# Patient Record
Sex: Male | Born: 1965 | Race: Black or African American | Hispanic: No | Smoking: Never smoker
Health system: Southern US, Community
[De-identification: ages and names within clinical notes are randomized; demographics above are authoritative.]

## PROBLEM LIST (undated history)

## (undated) DIAGNOSIS — E119 Type 2 diabetes mellitus without complications: Secondary | ICD-10-CM

## (undated) HISTORY — PX: COLON SURGERY: SHX602

---

## 2008-07-10 HISTORY — PX: OTHER SURGICAL HISTORY: SHX169

## 2009-04-20 ENCOUNTER — Encounter (INDEPENDENT_AMBULATORY_CARE_PROVIDER_SITE_OTHER): Payer: Self-pay | Admitting: General Surgery

## 2009-04-21 ENCOUNTER — Inpatient Hospital Stay (HOSPITAL_COMMUNITY): Admission: AC | Admit: 2009-04-21 | Discharge: 2009-04-28 | Payer: Self-pay

## 2009-05-04 ENCOUNTER — Inpatient Hospital Stay (HOSPITAL_COMMUNITY): Admission: EM | Admit: 2009-05-04 | Discharge: 2009-05-07 | Payer: Self-pay | Admitting: Emergency Medicine

## 2009-05-28 ENCOUNTER — Emergency Department (HOSPITAL_COMMUNITY): Admission: EM | Admit: 2009-05-28 | Discharge: 2009-05-28 | Payer: Self-pay | Admitting: Emergency Medicine

## 2010-10-13 LAB — DIFFERENTIAL
Basophils Relative: 0 % (ref 0–1)
Eosinophils Absolute: 0 10*3/uL (ref 0.0–0.7)
Lymphocytes Relative: 8 % — ABNORMAL LOW (ref 12–46)
Lymphs Abs: 1.2 10*3/uL (ref 0.7–4.0)
Monocytes Absolute: 1.3 10*3/uL — ABNORMAL HIGH (ref 0.1–1.0)
Neutro Abs: 13.3 10*3/uL — ABNORMAL HIGH (ref 1.7–7.7)
Neutrophils Relative %: 84 % — ABNORMAL HIGH (ref 43–77)

## 2010-10-13 LAB — CBC
HCT: 19.9 % — ABNORMAL LOW (ref 39.0–52.0)
HCT: 20.5 % — ABNORMAL LOW (ref 39.0–52.0)
HCT: 24.5 % — ABNORMAL LOW (ref 39.0–52.0)
HCT: 22.2 % — ABNORMAL LOW (ref 39.0–52.0)
HCT: 27.7 % — ABNORMAL LOW (ref 39.0–52.0)
HCT: 32.4 % — ABNORMAL LOW (ref 39.0–52.0)
HCT: 38.1 % — ABNORMAL LOW (ref 39.0–52.0)
Hemoglobin: 6.9 g/dL — CL (ref 13.0–17.0)
Hemoglobin: 7.4 g/dL — CL (ref 13.0–17.0)
Hemoglobin: 8.4 g/dL — ABNORMAL LOW (ref 13.0–17.0)
Hemoglobin: 10.9 g/dL — ABNORMAL LOW (ref 13.0–17.0)
Hemoglobin: 13.1 g/dL (ref 13.0–17.0)
Hemoglobin: 7.9 g/dL — CL (ref 13.0–17.0)
Hemoglobin: 9.6 g/dL — ABNORMAL LOW (ref 13.0–17.0)
MCHC: 33.3 g/dL (ref 30.0–36.0)
MCHC: 33.9 g/dL (ref 30.0–36.0)
MCHC: 34.5 g/dL (ref 30.0–36.0)
MCHC: 34.8 g/dL (ref 30.0–36.0)
MCHC: 35 g/dL (ref 30.0–36.0)
MCV: 93.3 fL (ref 78.0–100.0)
MCV: 94 fL (ref 78.0–100.0)
MCV: 96 fL (ref 78.0–100.0)
MCV: 95.5 fL (ref 78.0–100.0)
MCV: 95.9 fL (ref 78.0–100.0)
Platelets: 493 10*3/uL — ABNORMAL HIGH (ref 150–400)
Platelets: 512 10*3/uL — ABNORMAL HIGH (ref 150–400)
Platelets: 515 10*3/uL — ABNORMAL HIGH (ref 150–400)
Platelets: 134 10*3/uL — ABNORMAL LOW (ref 150–400)
Platelets: 165 10*3/uL (ref 150–400)
Platelets: 176 10*3/uL (ref 150–400)
RBC: 2.18 MIL/uL — ABNORMAL LOW (ref 4.22–5.81)
RBC: 2.38 MIL/uL — ABNORMAL LOW (ref 4.22–5.81)
RBC: 2.39 MIL/uL — ABNORMAL LOW (ref 4.22–5.81)
RBC: 2.72 MIL/uL — ABNORMAL LOW (ref 4.22–5.81)
RBC: 2.33 MIL/uL — ABNORMAL LOW (ref 4.22–5.81)
RBC: 2.36 MIL/uL — ABNORMAL LOW (ref 4.22–5.81)
RBC: 3.99 MIL/uL — ABNORMAL LOW (ref 4.22–5.81)
RDW: 15.7 % — ABNORMAL HIGH (ref 11.5–15.5)
RDW: 16.5 % — ABNORMAL HIGH (ref 11.5–15.5)
RDW: 19 % — ABNORMAL HIGH (ref 11.5–15.5)
RDW: 13.9 % (ref 11.5–15.5)
RDW: 13.9 % (ref 11.5–15.5)
RDW: 14.2 % (ref 11.5–15.5)
WBC: 15.8 10*3/uL — ABNORMAL HIGH (ref 4.0–10.5)
WBC: 9.5 10*3/uL (ref 4.0–10.5)
WBC: 9.5 10*3/uL (ref 4.0–10.5)
WBC: 11.4 10*3/uL — ABNORMAL HIGH (ref 4.0–10.5)
WBC: 9.1 10*3/uL (ref 4.0–10.5)
WBC: 9.8 10*3/uL (ref 4.0–10.5)

## 2010-10-13 LAB — TYPE AND SCREEN
Antibody Screen: NEGATIVE
Antibody Screen: NEGATIVE

## 2010-10-13 LAB — BASIC METABOLIC PANEL
BUN: 18 mg/dL (ref 6–23)
CO2: 29 mEq/L (ref 19–32)
Calcium: 8.4 mg/dL (ref 8.4–10.5)
GFR calc Af Amer: >60 mL/min (ref >60)
GFR calc non Af Amer: >60 mL/min (ref >60)
GFR calc non Af Amer: 56 mL/min — ABNORMAL LOW (ref >60)
Glucose, Bld: 146 mg/dL — ABNORMAL HIGH (ref 70–99)
Glucose, Bld: 129 mg/dL — ABNORMAL HIGH (ref 70–99)
Potassium: 3.9 mEq/L (ref 3.5–5.1)
Potassium: 3.8 mEq/L (ref 3.5–5.1)
Potassium: 4.5 mEq/L (ref 3.5–5.1)
Sodium: 139 mEq/L (ref 135–145)
Sodium: 138 mEq/L (ref 135–145)

## 2010-10-13 LAB — POCT I-STAT, CHEM 8
Calcium, Ion: 1 mmol/L — ABNORMAL LOW (ref 1.12–1.32)
Glucose, Bld: 152 mg/dL — ABNORMAL HIGH (ref 70–99)
HCT: 40 % (ref 39.0–52.0)
Hemoglobin: 13.6 g/dL (ref 13.0–17.0)
TCO2: 20 mmol/L (ref 0–100)

## 2010-10-13 LAB — GLUCOSE, CAPILLARY
Glucose-Capillary: 107 mg/dL — ABNORMAL HIGH (ref 70–99)
Glucose-Capillary: 114 mg/dL — ABNORMAL HIGH (ref 70–99)

## 2010-10-13 LAB — HEMOGLOBIN A1C: Hgb A1c MFr Bld: 6.1 % (ref 4.6–6.1)

## 2010-10-13 LAB — CULTURE, ROUTINE-ABSCESS

## 2010-10-13 LAB — COMPREHENSIVE METABOLIC PANEL
ALT: 31 U/L (ref 0–53)
ALT: 37 U/L (ref 0–53)
CO2: 25 mEq/L (ref 19–32)
Calcium: 8.8 mg/dL (ref 8.4–10.5)
Chloride: 101 mEq/L (ref 96–112)
Chloride: 108 mEq/L (ref 96–112)
GFR calc Af Amer: >60 mL/min (ref >60)
GFR calc non Af Amer: >60 mL/min (ref >60)
GFR calc non Af Amer: >60 mL/min (ref >60)
Glucose, Bld: 112 mg/dL — ABNORMAL HIGH (ref 70–99)
Total Bilirubin: 1.5 mg/dL — ABNORMAL HIGH (ref 0.3–1.2)
Total Protein: 6.4 g/dL (ref 6.0–8.3)
Total Protein: 7.2 g/dL (ref 6.0–8.3)

## 2010-10-13 LAB — URINALYSIS, MICROSCOPIC ONLY
Ketones, ur: NEGATIVE mg/dL
Leukocytes, UA: NEGATIVE
Nitrite: NEGATIVE
Specific Gravity, Urine: 1.025 (ref 1.005–1.030)
pH: 5 (ref 5.0–8.0)

## 2010-10-13 LAB — PROTIME-INR: Prothrombin Time: 13.5 seconds (ref 11.6–15.2)

## 2010-10-13 LAB — ETHANOL: Alcohol, Ethyl (B): 309 mg/dL — ABNORMAL HIGH (ref 0–10)

## 2010-10-13 LAB — POCT CARDIAC MARKERS: Troponin i, poc: <0.05 ng/mL (ref 0.00–0.09)

## 2010-10-13 LAB — ABO/RH: ABO/RH(D): O POS

## 2010-10-13 LAB — URINALYSIS, ROUTINE W REFLEX MICROSCOPIC
Ketones, ur: NEGATIVE mg/dL
Leukocytes, UA: NEGATIVE
pH: 6 (ref 5.0–8.0)

## 2010-10-13 LAB — URINE MICROSCOPIC-ADD ON

## 2010-10-13 LAB — POCT I-STAT 4, (NA,K, GLUC, HGB,HCT): HCT: 31 % — ABNORMAL LOW (ref 39.0–52.0)

## 2010-10-13 LAB — PREPARE RBC (CROSSMATCH)
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
## 2012-04-04 ENCOUNTER — Emergency Department (HOSPITAL_COMMUNITY): Payer: No Typology Code available for payment source

## 2012-04-04 ENCOUNTER — Emergency Department (HOSPITAL_COMMUNITY)
Admission: EM | Admit: 2012-04-04 | Discharge: 2012-04-04 | Disposition: A | Discharge status: Home / Self Care | Payer: No Typology Code available for payment source | Attending: Emergency Medicine | Admitting: Emergency Medicine

## 2012-04-04 ENCOUNTER — Encounter (HOSPITAL_COMMUNITY): Payer: Self-pay | Admitting: Emergency Medicine

## 2012-04-04 DIAGNOSIS — S161XXA Strain of muscle, fascia and tendon at neck level, initial encounter: Secondary | ICD-10-CM

## 2012-04-04 DIAGNOSIS — S139XXA Sprain of joints and ligaments of unspecified parts of neck, initial encounter: Secondary | ICD-10-CM | POA: Insufficient documentation

## 2012-04-04 DIAGNOSIS — S335XXA Sprain of ligaments of lumbar spine, initial encounter: Secondary | ICD-10-CM | POA: Insufficient documentation

## 2012-04-04 MED ORDER — HYDROCODONE-ACETAMINOPHEN 5-325 MG PO TABS: 2.0000 | ORAL_TABLET | ORAL | Status: DC | PRN

## 2012-04-04 MED ORDER — IBUPROFEN 400 MG PO TABS
800.0000 mg | ORAL_TABLET | Freq: Once | ORAL | Status: AC
  Administered 2012-04-04: 800 mg via ORAL
  Filled 2012-04-04: qty 2

## 2012-04-04 MED ORDER — IBUPROFEN 800 MG PO TABS: 800.0000 mg | ORAL_TABLET | Freq: Three times a day (TID) | ORAL | Status: DC
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
## 2016-12-13 ENCOUNTER — Emergency Department (HOSPITAL_COMMUNITY): Payer: Self-pay

## 2016-12-13 ENCOUNTER — Encounter (HOSPITAL_COMMUNITY)
Admission: EM | Disposition: A | Discharge status: Home / Self Care | Payer: Self-pay | Source: Home / Self Care | Attending: Urology

## 2016-12-13 ENCOUNTER — Inpatient Hospital Stay (HOSPITAL_COMMUNITY): Payer: Self-pay | Admitting: Certified Registered Nurse Anesthetist

## 2016-12-13 ENCOUNTER — Encounter (HOSPITAL_COMMUNITY): Payer: Self-pay

## 2016-12-13 ENCOUNTER — Inpatient Hospital Stay (HOSPITAL_COMMUNITY)
Admission: EM | Admit: 2016-12-13 | Discharge: 2016-12-20 | DRG: 728 | Disposition: A | Discharge status: Home / Self Care | Payer: Self-pay | Attending: Urology | Admitting: Urology

## 2016-12-13 DIAGNOSIS — N451 Epididymitis: Secondary | ICD-10-CM

## 2016-12-13 DIAGNOSIS — N5089 Other specified disorders of the male genital organs: Secondary | ICD-10-CM | POA: Diagnosis present

## 2016-12-13 DIAGNOSIS — B9562 Methicillin resistant Staphylococcus aureus infection as the cause of diseases classified elsewhere: Secondary | ICD-10-CM | POA: Diagnosis present

## 2016-12-13 DIAGNOSIS — N492 Inflammatory disorders of scrotum: Principal | ICD-10-CM | POA: Diagnosis present

## 2016-12-13 DIAGNOSIS — N179 Acute kidney failure, unspecified: Secondary | ICD-10-CM | POA: Diagnosis present

## 2016-12-13 DIAGNOSIS — E872 Acidosis: Secondary | ICD-10-CM | POA: Diagnosis present

## 2016-12-13 DIAGNOSIS — D72829 Elevated white blood cell count, unspecified: Secondary | ICD-10-CM

## 2016-12-13 DIAGNOSIS — Z6834 Body mass index (BMI) 34.0-34.9, adult: Secondary | ICD-10-CM

## 2016-12-13 DIAGNOSIS — Z1621 Resistance to vancomycin: Secondary | ICD-10-CM | POA: Diagnosis present

## 2016-12-13 DIAGNOSIS — N472 Paraphimosis: Secondary | ICD-10-CM | POA: Diagnosis present

## 2016-12-13 HISTORY — PX: INCISION AND DRAINAGE ABSCESS: SHX5864

## 2016-12-13 LAB — COMPREHENSIVE METABOLIC PANEL
ALK PHOS: 61 U/L (ref 38–126)
ALT: 22 U/L (ref 17–63)
ANION GAP: 14 (ref 5–15)
AST: 39 U/L (ref 15–41)
Albumin: 3.2 g/dL — ABNORMAL LOW (ref 3.5–5.0)
BILIRUBIN TOTAL: 2.1 mg/dL — AB (ref 0.3–1.2)
BUN: 15 mg/dL (ref 6–20)
CALCIUM: 8.4 mg/dL — AB (ref 8.9–10.3)
CO2: 19 mmol/L — ABNORMAL LOW (ref 22–32)
CREATININE: 2.55 mg/dL — AB (ref 0.61–1.24)
Chloride: 99 mmol/L — ABNORMAL LOW (ref 101–111)
GFR calc Af Amer: 33 mL/min — ABNORMAL LOW (ref >60)
GFR, EST NON AFRICAN AMERICAN: 28 mL/min — AB (ref >60)
Glucose, Bld: 181 mg/dL — ABNORMAL HIGH (ref 65–99)
Potassium: 3.7 mmol/L (ref 3.5–5.1)
Sodium: 132 mmol/L — ABNORMAL LOW (ref 135–145)
TOTAL PROTEIN: 7.8 g/dL (ref 6.5–8.1)

## 2016-12-13 LAB — I-STAT CG4 LACTIC ACID, ED
Lactic Acid, Venous: 2.95 mmol/L (ref 0.5–1.9)
Lactic Acid, Venous: 1.14 mmol/L (ref 0.5–1.9)

## 2016-12-13 LAB — URINALYSIS, ROUTINE W REFLEX MICROSCOPIC
Bilirubin Urine: NEGATIVE
Glucose, UA: NEGATIVE mg/dL
Ketones, ur: NEGATIVE mg/dL
Leukocytes, UA: NEGATIVE
NITRITE: NEGATIVE
Protein, ur: 100 mg/dL — AB
SPECIFIC GRAVITY, URINE: 1.016 (ref 1.005–1.030)
pH: 5 (ref 5.0–8.0)

## 2016-12-13 LAB — CBC
HCT: 35.2 % — ABNORMAL LOW (ref 39.0–52.0)
Hemoglobin: 12.2 g/dL — ABNORMAL LOW (ref 13.0–17.0)
MCH: 31.8 pg (ref 26.0–34.0)
MCHC: 34.7 g/dL (ref 30.0–36.0)
MCV: 91.7 fL (ref 78.0–100.0)
PLATELETS: 162 10*3/uL (ref 150–400)
RBC: 3.84 MIL/uL — ABNORMAL LOW (ref 4.22–5.81)
RDW: 13.3 % (ref 11.5–15.5)
WBC: 15.2 10*3/uL — AB (ref 4.0–10.5)

## 2016-12-13 LAB — CREATININE, SERUM
CREATININE: 1.84 mg/dL — AB (ref 0.61–1.24)
GFR calc non Af Amer: 42 mL/min — ABNORMAL LOW (ref >60)
GFR, EST AFRICAN AMERICAN: 48 mL/min — AB (ref >60)

## 2016-12-13 LAB — CBC WITH DIFFERENTIAL/PLATELET
BASOS ABS: 0 10*3/uL (ref 0.0–0.1)
BASOS PCT: 0 %
EOS ABS: 0 10*3/uL (ref 0.0–0.7)
Eosinophils Relative: 0 %
HCT: 41.9 % (ref 39.0–52.0)
Hemoglobin: 14.5 g/dL (ref 13.0–17.0)
Lymphocytes Relative: 6 %
Lymphs Abs: 1 10*3/uL (ref 0.7–4.0)
MCH: 31.7 pg (ref 26.0–34.0)
MCHC: 34.6 g/dL (ref 30.0–36.0)
MCV: 91.7 fL (ref 78.0–100.0)
MONO ABS: 1.8 10*3/uL — AB (ref 0.1–1.0)
Monocytes Relative: 11 %
NEUTROS PCT: 83 %
Neutro Abs: 13.5 10*3/uL — ABNORMAL HIGH (ref 1.7–7.7)
PLATELETS: 162 10*3/uL (ref 150–400)
RBC: 4.57 MIL/uL (ref 4.22–5.81)
RDW: 13.4 % (ref 11.5–15.5)
WBC: 16.3 10*3/uL — AB (ref 4.0–10.5)

## 2016-12-13 LAB — MRSA PCR SCREENING: MRSA by PCR: NEGATIVE

## 2016-12-13 SURGERY — INCISION AND DRAINAGE, ABSCESS
Anesthesia: General | Site: Scrotum

## 2016-12-13 MED ORDER — ACETAMINOPHEN 325 MG PO TABS
650.0000 mg | ORAL_TABLET | Freq: Four times a day (QID) | ORAL | Status: DC | PRN
  Administered 2016-12-14 – 2016-12-16 (×4): 650 mg via ORAL
  Filled 2016-12-13 (×5): qty 2

## 2016-12-13 MED ORDER — PROPOFOL 10 MG/ML IV BOLUS
INTRAVENOUS | Status: AC
  Filled 2016-12-13: qty 20

## 2016-12-13 MED ORDER — LACTATED RINGERS IV SOLN: INTRAVENOUS | Status: DC

## 2016-12-13 MED ORDER — FENTANYL CITRATE (PF) 100 MCG/2ML IJ SOLN
25.0000 ug | INTRAMUSCULAR | Status: DC | PRN
  Administered 2016-12-13 (×2): 50 ug via INTRAVENOUS

## 2016-12-13 MED ORDER — HYDROCODONE-ACETAMINOPHEN 5-325 MG PO TABS: 1.0000 | ORAL_TABLET | Freq: Four times a day (QID) | ORAL | 0 refills | Status: DC | PRN

## 2016-12-13 MED ORDER — TRAMADOL HCL 50 MG PO TABS: 50.0000 mg | ORAL_TABLET | Freq: Four times a day (QID) | ORAL | Status: DC | PRN

## 2016-12-13 MED ORDER — DEXTROSE 5 % IV SOLN
1.0000 g | INTRAVENOUS | Status: DC
  Administered 2016-12-14 – 2016-12-15 (×2): 1 g via INTRAVENOUS
  Filled 2016-12-13 (×2): qty 10

## 2016-12-13 MED ORDER — SODIUM CHLORIDE 0.9 % IV BOLUS (SEPSIS)
1000.0000 mL | Freq: Once | INTRAVENOUS | Status: AC
  Administered 2016-12-13: 1000 mL via INTRAVENOUS

## 2016-12-13 MED ORDER — HYDROCODONE-ACETAMINOPHEN 5-325 MG PO TABS
1.0000 | ORAL_TABLET | Freq: Four times a day (QID) | ORAL | Status: DC | PRN
  Administered 2016-12-13 – 2016-12-20 (×8): 2 via ORAL
  Filled 2016-12-13 (×9): qty 2

## 2016-12-13 MED ORDER — DIPHENHYDRAMINE HCL 12.5 MG/5ML PO ELIX: 12.5000 mg | ORAL_SOLUTION | Freq: Four times a day (QID) | ORAL | Status: DC | PRN

## 2016-12-13 MED ORDER — LACTATED RINGERS IV SOLN
INTRAVENOUS | Status: DC
  Administered 2016-12-13 – 2016-12-15 (×5): via INTRAVENOUS

## 2016-12-13 MED ORDER — FENTANYL CITRATE (PF) 100 MCG/2ML IJ SOLN
INTRAMUSCULAR | Status: AC
  Filled 2016-12-13: qty 2

## 2016-12-13 MED ORDER — HYDROMORPHONE HCL 1 MG/ML IJ SOLN
INTRAMUSCULAR | Status: AC
  Filled 2016-12-13: qty 1

## 2016-12-13 MED ORDER — LIDOCAINE 2% (20 MG/ML) 5 ML SYRINGE
INTRAMUSCULAR | Status: DC | PRN
  Administered 2016-12-13: 60 mg via INTRAVENOUS

## 2016-12-13 MED ORDER — ACETAMINOPHEN 650 MG RE SUPP
650.0000 mg | Freq: Four times a day (QID) | RECTAL | Status: DC | PRN
  Administered 2016-12-13: 650 mg via RECTAL
  Filled 2016-12-13: qty 1

## 2016-12-13 MED ORDER — MORPHINE SULFATE (PF) 4 MG/ML IV SOLN
4.0000 mg | Freq: Once | INTRAVENOUS | Status: AC
  Administered 2016-12-13: 4 mg via INTRAVENOUS
  Filled 2016-12-13: qty 1

## 2016-12-13 MED ORDER — MIDAZOLAM HCL 2 MG/2ML IJ SOLN
INTRAMUSCULAR | Status: AC
  Filled 2016-12-13: qty 2

## 2016-12-13 MED ORDER — HYDROMORPHONE HCL 1 MG/ML IJ SOLN
1.0000 mg | INTRAMUSCULAR | Status: DC | PRN
  Administered 2016-12-14 (×2): 1 mg via INTRAVENOUS
  Filled 2016-12-13 (×3): qty 1

## 2016-12-13 MED ORDER — PHENYLEPHRINE 40 MCG/ML (10ML) SYRINGE FOR IV PUSH (FOR BLOOD PRESSURE SUPPORT)
PREFILLED_SYRINGE | INTRAVENOUS | Status: DC | PRN
  Administered 2016-12-13: 80 ug via INTRAVENOUS
  Administered 2016-12-13 (×2): 120 ug via INTRAVENOUS
  Administered 2016-12-13: 80 ug via INTRAVENOUS
  Administered 2016-12-13: 120 ug via INTRAVENOUS

## 2016-12-13 MED ORDER — ENOXAPARIN SODIUM 40 MG/0.4ML ~~LOC~~ SOLN: 40.0000 mg | SUBCUTANEOUS | Status: DC

## 2016-12-13 MED ORDER — ONDANSETRON HCL 4 MG/2ML IJ SOLN
4.0000 mg | INTRAMUSCULAR | Status: DC | PRN
  Administered 2016-12-13 – 2016-12-18 (×2): 4 mg via INTRAVENOUS

## 2016-12-13 MED ORDER — MEPERIDINE HCL 50 MG/ML IJ SOLN: 6.2500 mg | INTRAMUSCULAR | Status: DC | PRN

## 2016-12-13 MED ORDER — DEXTROSE 5 % IV SOLN
2.0000 g | Freq: Once | INTRAVENOUS | Status: AC
  Administered 2016-12-13: 2 g via INTRAVENOUS
  Filled 2016-12-13: qty 2

## 2016-12-13 MED ORDER — MIDAZOLAM HCL 5 MG/5ML IJ SOLN
INTRAMUSCULAR | Status: DC | PRN
  Administered 2016-12-13: 2 mg via INTRAVENOUS

## 2016-12-13 MED ORDER — DIPHENHYDRAMINE HCL 50 MG/ML IJ SOLN: 12.5000 mg | Freq: Four times a day (QID) | INTRAMUSCULAR | Status: DC | PRN

## 2016-12-13 MED ORDER — DEXTROSE 5 % IV SOLN: 1.0000 g | INTRAVENOUS | Status: DC

## 2016-12-13 MED ORDER — METOCLOPRAMIDE HCL 5 MG/ML IJ SOLN
10.0000 mg | Freq: Once | INTRAMUSCULAR | Status: DC | PRN
Start: 2016-12-13 — End: 2016-12-13

## 2016-12-13 MED ORDER — SODIUM CHLORIDE 0.9 % IV BOLUS (SEPSIS)
500.0000 mL | Freq: Once | INTRAVENOUS | Status: AC
  Administered 2016-12-13: 500 mL via INTRAVENOUS

## 2016-12-13 MED ORDER — DOCUSATE SODIUM 100 MG PO CAPS
100.0000 mg | ORAL_CAPSULE | Freq: Two times a day (BID) | ORAL | Status: DC
  Administered 2016-12-13 – 2016-12-20 (×9): 100 mg via ORAL
  Filled 2016-12-13 (×11): qty 1

## 2016-12-13 MED ORDER — FENTANYL CITRATE (PF) 100 MCG/2ML IJ SOLN
INTRAMUSCULAR | Status: DC | PRN
  Administered 2016-12-13 (×4): 50 ug via INTRAVENOUS

## 2016-12-13 MED ORDER — LACTATED RINGERS IV SOLN
INTRAVENOUS | Status: DC | PRN
  Administered 2016-12-13: 20:00:00 via INTRAVENOUS

## 2016-12-13 MED ORDER — PROPOFOL 10 MG/ML IV BOLUS
INTRAVENOUS | Status: DC | PRN
  Administered 2016-12-13: 200 mg via INTRAVENOUS

## 2016-12-13 MED ORDER — HYDROMORPHONE HCL 1 MG/ML IJ SOLN: 0.5000 mg | INTRAMUSCULAR | Status: DC | PRN

## 2016-12-13 MED ORDER — PHENYLEPHRINE 40 MCG/ML (10ML) SYRINGE FOR IV PUSH (FOR BLOOD PRESSURE SUPPORT)
PREFILLED_SYRINGE | INTRAVENOUS | Status: AC
  Filled 2016-12-13: qty 20

## 2016-12-13 SURGICAL SUPPLY — 44 items
BENZOIN TINCTURE PRP APPL 2/3 (GAUZE/BANDAGES/DRESSINGS) IMPLANT
BLADE HEX COATED 2.75 (ELECTRODE) ×3 IMPLANT
BLADE SURG 15 STRL LF DISP TIS (BLADE) ×1 IMPLANT
BLADE SURG 15 STRL SS (BLADE) ×2
BLADE SURG SZ10 CARB STEEL (BLADE) ×3 IMPLANT
BNDG GAUZE ELAST 4 BULKY (GAUZE/BANDAGES/DRESSINGS) ×3 IMPLANT
CLOSURE WOUND 1/2 X4 (GAUZE/BANDAGES/DRESSINGS)
COVER SURGICAL LIGHT HANDLE (MISCELLANEOUS) ×3 IMPLANT
DECANTER SPIKE VIAL GLASS SM (MISCELLANEOUS) IMPLANT
DRAIN PENROSE 18X1/4 LTX STRL (WOUND CARE) ×3 IMPLANT
DRAPE LAPAROSCOPIC ABDOMINAL (DRAPES) IMPLANT
DRAPE LAPAROTOMY T 102X78X121 (DRAPES) IMPLANT
DRAPE LAPAROTOMY TRNSV 102X78 (DRAPE) IMPLANT
DRAPE POUCH INSTRU U-SHP 10X18 (DRAPES) ×3 IMPLANT
DRAPE SHEET LG 3/4 BI-LAMINATE (DRAPES) IMPLANT
DRSG PAD ABDOMINAL 8X10 ST (GAUZE/BANDAGES/DRESSINGS) ×6 IMPLANT
ELECT PENCIL ROCKER SW 15FT (MISCELLANEOUS) ×3 IMPLANT
ELECT REM PT RETURN 15FT ADLT (MISCELLANEOUS) ×3 IMPLANT
EVACUATOR SILICONE 100CC (DRAIN) IMPLANT
GAUZE SPONGE 4X4 12PLY STRL (GAUZE/BANDAGES/DRESSINGS) IMPLANT
GLOVE BIOGEL PI IND STRL 7.0 (GLOVE) ×1 IMPLANT
GLOVE BIOGEL PI INDICATOR 7.0 (GLOVE) ×2
GLOVE EUDERMIC 7 POWDERFREE (GLOVE) ×3 IMPLANT
GOWN STRL REUS W/TWL LRG LVL3 (GOWN DISPOSABLE) ×3 IMPLANT
GOWN STRL REUS W/TWL XL LVL3 (GOWN DISPOSABLE) IMPLANT
HANDPIECE INTERPULSE COAX TIP (DISPOSABLE) ×2
KIT BASIN OR (CUSTOM PROCEDURE TRAY) ×3 IMPLANT
NEEDLE HYPO 25X1 1.5 SAFETY (NEEDLE) IMPLANT
NS IRRIG 1000ML POUR BTL (IV SOLUTION) ×3 IMPLANT
PACK BASIC VI WITH GOWN DISP (CUSTOM PROCEDURE TRAY) ×3 IMPLANT
SET HNDPC FAN SPRY TIP SCT (DISPOSABLE) ×1 IMPLANT
SOL PREP POV-IOD 4OZ 10% (MISCELLANEOUS) IMPLANT
SPONGE LAP 18X18 X RAY DECT (DISPOSABLE) ×6 IMPLANT
SPONGE LAP 4X18 X RAY DECT (DISPOSABLE) ×3 IMPLANT
STAPLER VISISTAT 35W (STAPLE) IMPLANT
STRIP CLOSURE SKIN 1/2X4 (GAUZE/BANDAGES/DRESSINGS) IMPLANT
SUT CHROMIC 2 0 SH (SUTURE) ×6 IMPLANT
SUT ETHILON 3 0 PS 1 (SUTURE) ×3 IMPLANT
SUT MNCRL AB 4-0 PS2 18 (SUTURE) IMPLANT
SUT VIC AB 3-0 SH 18 (SUTURE) IMPLANT
SYR CONTROL 10ML LL (SYRINGE) ×3 IMPLANT
TOWEL OR 17X26 10 PK STRL BLUE (TOWEL DISPOSABLE) ×3 IMPLANT
WATER STERILE IRR 1500ML POUR (IV SOLUTION) IMPLANT
YANKAUER SUCT BULB TIP 10FT TU (MISCELLANEOUS) IMPLANT

## 2016-12-13 NOTE — Transfer of Care (Signed)
Immediate Anesthesia Transfer of Care Note  Patient: Stephen BostonJohn Knight  Procedure(s) Performed: Procedure(s): INCISION AND DRAINAGE OF SCROTAL ABSCESS (N/A)  Patient Location: PACU  Anesthesia Type:General  Level of Consciousness: awake, alert  and oriented  Airway & Oxygen Therapy: Patient Spontanous Breathing and Patient connected to face mask oxygen  Post-op Assessment: Report given to RN and Post -op Vital signs reviewed and stable  Post vital signs: Reviewed and stable  Last Vitals:  Vitals:   12/13/16 1556 12/13/16 1900  BP: 140/84   Pulse: (!) 114   Resp: 20   Temp: (!) 39.4 C (!) 39.6 C    Last Pain:  Vitals:   12/13/16 1900  TempSrc: Oral  PainSc:       Patients Stated Pain Goal: 3 (12/13/16 1625)  Complications: No apparent anesthesia complications

## 2016-12-13 NOTE — Anesthesia Preprocedure Evaluation (Signed)
Anesthesia Evaluation  Patient identified by MRN, date of birth, ID band Patient awake    Reviewed: Allergy & Precautions, NPO status , Patient's Chart, lab work & pertinent test results  Airway Mallampati: II  TM Distance: >3 FB Neck ROM: Full    Dental no notable dental hx.    Pulmonary neg pulmonary ROS,    Pulmonary exam normal breath sounds clear to auscultation       Cardiovascular negative cardio ROS Normal cardiovascular exam Rhythm:Regular Rate:Normal     Neuro/Psych negative neurological ROS  negative psych ROS   GI/Hepatic negative GI ROS, Neg liver ROS,   Endo/Other  negative endocrine ROS  Renal/GU negative Renal ROS  negative genitourinary   Musculoskeletal negative musculoskeletal ROS (+)   Abdominal   Peds negative pediatric ROS (+)  Hematology negative hematology ROS (+)   Anesthesia Other Findings   Reproductive/Obstetrics negative OB ROS                             Anesthesia Physical Anesthesia Plan  ASA: II  Anesthesia Plan: General   Post-op Pain Management:    Induction: Intravenous  PONV Risk Score and Plan: 2 and Ondansetron, Dexamethasone and Treatment may vary due to age  Airway Management Planned: LMA  Additional Equipment:   Intra-op Plan:   Post-operative Plan: Extubation in OR  Informed Consent: I have reviewed the patients History and Physical, chart, labs and discussed the procedure including the risks, benefits and alternatives for the proposed anesthesia with the patient or authorized representative who has indicated his/her understanding and acceptance.   Dental advisory given  Plan Discussed with: CRNA  Anesthesia Plan Comments:         Anesthesia Quick Evaluation

## 2016-12-13 NOTE — ED Provider Notes (Signed)
[facility]-EMERGENCY DEPT Provider Note   CSN: [id number] Arrival date & time: 12/13/16  0903     History   Chief Complaint No chief complaint on file.   HPI Stephen Knight is a 51 y.o. male.  HPI  51 y.o. male, presents to the Emergency Department today due to x 2 days of testicular swelling and pain. Noted in the AM. No pain with urination. Went to health department and told "there was nothing we could do for you." pt proceeded to come to ED. Notes subjective fevers, but noted fever in ED. No N/V. No CP/SOB. Rates pain 8/10. Notes suprapubic pressure. Pt states that the swelling has gradually gotten worse and has swollen his testicles as well as the shaft of his penis into the glans. Pt is sexually active with one partner. Denies penile discharge. No other symptoms noted.    History reviewed. No pertinent past medical history.  There are no active problems to display for this patient.   History reviewed. No pertinent surgical history.     Home Medications    Prior to Admission medications   Medication Sig Start Date End Date Taking? Authorizing Provider  fish oil-omega-3 fatty acids 1000 MG capsule Take 1 g by mouth daily.    [provider]  HYDROcodone-acetaminophen (NORCO/VICODIN) 5-325 MG per tablet Take 2 tablets by mouth every 4 (four) hours as needed for pain. 04/04/12   Rancour, Jeannett Senior, MD  ibuprofen (ADVIL,MOTRIN) 800 MG tablet Take 1 tablet (800 mg total) by mouth 3 (three) times daily. 04/04/12   Rancour, Jeannett Senior, MD  Multiple Vitamin (MULTIVITAMIN WITH MINERALS) TABS Take 1 tablet by mouth daily.    [provider]    Family History No family history on file.  Social History Social History  Substance Use Topics  . Smoking status: Never Smoker  . Smokeless tobacco: Not on file  . Alcohol use Yes     Comment: occ     Allergies   Patient has no known allergies.   Review of Systems Review of Systems ROS reviewed and all are negative for  acute change except as noted in the HPI.  Physical Exam Updated Vital Signs BP (!) 161/110   Pulse (!) 137   Temp 100.3 F (37.9 C) (Oral)   Resp 20   Ht 5\' 11"  (1.803 m)   Wt 113.4 kg (250 lb)   SpO2 99%   BMI 34.87 kg/m   Physical Exam  Constitutional: He is oriented to person, place, and time. Vital signs are normal. He appears well-developed and well-nourished. No distress.  HENT:  Head: Normocephalic and atraumatic.  Right Ear: Hearing, tympanic membrane, external ear and ear canal normal.  Left Ear: Hearing, tympanic membrane, external ear and ear canal normal.  Nose: Nose normal.  Mouth/Throat: Uvula is midline, oropharynx is clear and moist and mucous membranes are normal. No trismus in the jaw. No oropharyngeal exudate, posterior oropharyngeal erythema or tonsillar abscesses.  Eyes: Conjunctivae and EOM are normal. Pupils are equal, round, and reactive to light.  Neck: Normal range of motion. Neck supple. No tracheal deviation present.  Cardiovascular: Normal rate, regular rhythm, S1 normal, S2 normal, normal heart sounds, intact distal pulses and normal pulses.   Pulmonary/Chest: Effort normal and breath sounds normal. No respiratory distress. He has no decreased breath sounds. He has no wheezes. He has no rhonchi. He has no rales.  Abdominal: Soft. Normal appearance and bowel sounds are normal. There is tenderness in the suprapubic area. There is  no rigidity, no rebound, no guarding, no CVA tenderness, no tenderness at McBurney's point and negative Murphy's sign.  Genitourinary:  Genitourinary Comments: Chaperone present. Diffuse swelling bilateral testicles. TTP. Tense on palpation. Phimosis noted.   Musculoskeletal: Normal range of motion.  Neurological: He is alert and oriented to person, place, and time.  Skin: Skin is warm and dry.  Psychiatric: He has a normal mood and affect. His speech is normal and behavior is normal. Thought content normal.  Nursing note and  vitals reviewed.          ED Treatments / Results  Labs (all labs ordered are listed, but only abnormal results are displayed) Labs Reviewed  COMPREHENSIVE METABOLIC PANEL - Abnormal; Notable for the following:       Result Value   Sodium 132 (*)    Chloride 99 (*)    CO2 19 (*)    Glucose, Bld 181 (*)    Creatinine, Ser 2.55 (*)    Calcium 8.4 (*)    Albumin 3.2 (*)    Total Bilirubin 2.1 (*)    GFR calc non Af Amer 28 (*)    GFR calc Af Amer 33 (*)    All other components within normal limits  CBC WITH DIFFERENTIAL/PLATELET - Abnormal; Notable for the following:    WBC 16.3 (*)    Neutro Abs 13.5 (*)    Monocytes Absolute 1.8 (*)    All other components within normal limits  URINALYSIS, ROUTINE W REFLEX MICROSCOPIC - Abnormal; Notable for the following:    Color, Urine AMBER (*)    APPearance HAZY (*)    Hgb urine dipstick LARGE (*)    Protein, ur 100 (*)    Bacteria, UA RARE (*)    Squamous Epithelial / LPF 0-5 (*)    All other components within normal limits  I-STAT CG4 LACTIC ACID, ED - Abnormal; Notable for the following:    Lactic Acid, Venous 2.95 (*)    All other components within normal limits  CULTURE, BLOOD (ROUTINE X 2)  CULTURE, BLOOD (ROUTINE X 2)  URINE CULTURE  HSV(HERPES SMPLX)ABS-I+II(IGG+IGM)-BLD  I-STAT CG4 LACTIC ACID, ED  GC/CHLAMYDIA PROBE AMP ([place]) NOT AT [facility]    EKG  EKG Interpretation None       Radiology Dg Chest 2 View  Result Date: 12/13/2016 CLINICAL DATA:  Fever.  Scrotal swelling for 2 days. EXAM: CHEST  2 VIEW COMPARISON:  05/04/2009 FINDINGS: Normal heart size and mediastinal contours. No acute infiltrate or edema. No effusion or pneumothorax. Spondylosis. No acute osseous findings. IMPRESSION: Negative chest. Electronically Signed   By: Marnee Spring M.D.   On: 12/13/2016 10:38   US Scrotum  Result Date: 12/13/2016 CLINICAL DATA:  Scrotal pain and swelling for the past 3 days with no known trauma. EXAM:  SCROTAL ULTRASOUND DOPPLER ULTRASOUND OF THE TESTICLES TECHNIQUE: Complete ultrasound examination of the testicles, epididymis, and other scrotal structures was performed. Color and spectral Doppler ultrasound were also utilized to evaluate blood flow to the testicles. COMPARISON:  None in PACs FINDINGS: Right testicle Measurements: 3.0 x 1.9 x 2.7 cm. No mass or microlithiasis visualized. Left testicle Measurements: 2.8 x 2.0 x 2.7 cm. No mass or microlithiasis visualized. Right epididymis: The right epididymis is subjectively enlarged. Vascularity appears normal. Left epididymis: The left epididymis is subjectively enlarged. Vascularity appears normal. Hydrocele:  None visualized. Varicocele:  There is a left-sided varicocele. Pulsed Doppler interrogation of both testes demonstrates normal low resistance arterial and venous waveforms  bilaterally. IMPRESSION: Normal echotexture and vascularity of the testes. Prominent epididymal structures bilaterally which may reflect epididymitis. Vascularity is not significantly increased however. No hydrocele.  Moderate size left-sided varicocele. Electronically Signed   By: David  [place] M.D.   On: 12/13/2016 11:02   [place] Art/ven Flow Abd Pelv Doppler  Result Date: 12/13/2016 CLINICAL DATA:  Scrotal pain and swelling for the past 3 days with no known trauma. EXAM: SCROTAL ULTRASOUND DOPPLER ULTRASOUND OF THE TESTICLES TECHNIQUE: Complete ultrasound examination of the testicles, epididymis, and other scrotal structures was performed. Color and spectral Doppler ultrasound were also utilized to evaluate blood flow to the testicles. COMPARISON:  None in PACs FINDINGS: Right testicle Measurements: 3.0 x 1.9 x 2.7 cm. No mass or microlithiasis visualized. Left testicle Measurements: 2.8 x 2.0 x 2.7 cm. No mass or microlithiasis visualized. Right epididymis: The right epididymis is subjectively enlarged. Vascularity appears normal. Left epididymis: The left epididymis is  subjectively enlarged. Vascularity appears normal. Hydrocele:  None visualized. Varicocele:  There is a left-sided varicocele. Pulsed Doppler interrogation of both testes demonstrates normal low resistance arterial and venous waveforms bilaterally. IMPRESSION: Normal echotexture and vascularity of the testes. Prominent epididymal structures bilaterally which may reflect epididymitis. Vascularity is not significantly increased however. No hydrocele.  Moderate size left-sided varicocele. Electronically Signed   By: David  [place] M.D.   On: 12/13/2016 11:02    Procedures Procedures (including critical care time)  Medications Ordered in ED Medications  morphine 4 MG/ML injection 4 mg (4 mg Intravenous Given 12/13/16 1018)  cefTRIAXone (ROCEPHIN) 2 g in dextrose 5 % 50 mL IVPB (0 g Intravenous Stopped 12/13/16 1047)  sodium chloride 0.9 % bolus 1,000 mL (0 mLs Intravenous Stopped 12/13/16 1140)    And  sodium chloride 0.9 % bolus 1,000 mL (0 mLs Intravenous Stopped 12/13/16 1141)    And  sodium chloride 0.9 % bolus 1,000 mL (0 mLs Intravenous Stopped 12/13/16 1215)    And  sodium chloride 0.9 % bolus 500 mL (0 mLs Intravenous Stopped 12/13/16 1200)  morphine 4 MG/ML injection 4 mg (4 mg Intravenous Given 12/13/16 1129)     Initial Impression / Assessment and Plan / ED Course  I have reviewed the triage vital signs and the nursing notes.  Pertinent labs & imaging results that were available during my care of the patient were reviewed by me and considered in my medical decision making (see chart for details).  Final Clinical Impressions(s) / ED Diagnoses  {I have reviewed and evaluated the relevant laboratory values. {I have reviewed and evaluated the relevant imaging studies. {I have interpreted the relevant EKG. {I have reviewed the relevant previous healthcare records. {I have reviewed EMS Documentation. {I obtained HPI from historian. {Patient discussed with supervising physician.  ED  Course:  Assessment: Pt is a 51 y.o. male who presents with diffuse testicular swelling and paraphimosis x 2 days. Pain on palpation of testes. No trauma to area.. On exam, pt in Nontoxic/nonseptic appearing. VS with tachycardia. Temp 100.17F. Lungs CTA. Heart RRR. Abdomen TTP suprapubic. GU exam with paraphimosis noted with gross edema. Testicular swelling bilaterally and TTP. No erythema. Pt states ability to urinate, but dribbles out. Bladder scan shows no residual urine. iStat Lactate 2.95. WBC 16.3. Code Sepsis initiated. Made NPO. Given Rocephin abx to cover Urinary source. NS given as per Sepsis protocol. Testicular US shows possible bilateral epididymitis. CXR unremarkable. UA obtained via in and out cath. Culture sent. UA unremarkable. Consult to Urology (Dr. Laverle Patter) will follow  along. Plan is to Admit to medicine. Repeat sepsis assessment completed.  1:35 PM- Urology has seen in ED. Likely scrotal Abscess. [facility]rology [facility]  Disposition/Plan:  Admit Pt acknowledges and agrees with plan  Supervising Physician Arby BarrettePfeiffer, Marcy, MD  Final diagnoses:  Epididymitis  Leukocytosis, unspecified type  AKI (acute kidney injury) [facility](HCC)    New Prescriptions New Prescriptions   No medications on file     Audry PiliMohr, Lalah Durango, Cordelia Poche-C 12/13/16 1336    Arby BarrettePfeiffer, Marcy, MD 12/18/16 [phone]

## 2016-12-13 NOTE — Anesthesia Procedure Notes (Signed)
Procedure Name: LMA Insertion Performed by: Lajada Janes J Pre-anesthesia Checklist: Patient identified, Emergency Drugs available, Suction available, Patient being monitored and Timeout performed Patient Re-evaluated:Patient Re-evaluated prior to inductionOxygen Delivery Method: Circle system utilized Preoxygenation: Pre-oxygenation with 100% oxygen Intubation Type: IV induction Ventilation: Mask ventilation without difficulty LMA: LMA inserted LMA Size: 5.0 Number of attempts: 1 Placement Confirmation: positive ETCO2,  CO2 detector and breath sounds checked- equal and bilateral Tube secured with: Tape Dental Injury: Teeth and Oropharynx as per pre-operative assessment        

## 2016-12-13 NOTE — H&P (Signed)
H&P  Chief Complaint: Scrotal swelling and pain  History of Present Illness: Mr. Brumbach is a 51 year old otherwise healthy gentleman who presented to the emergency department today with 2 days of severe scrotal and penile swelling and pain particularly in the right hemiscrotum.  He has not noted any associated dysuria, hematuria, or other voiding complaints.  He did have fever up to 102 yesterday.  Upon presentation to the emergency department, he did have a scrotal ultrasound performed that did not demonstrate any specific concerning findings.  History reviewed. No pertinent past medical history.  History reviewed. No pertinent surgical history.  Home Medications:  Current Meds  Medication Sig  . acetaminophen-codeine (TYLENOL #3) 300-30 MG tablet Take 1 tablet by mouth every 4 (four) hours as needed for moderate pain or severe pain.  . fish oil-omega-3 fatty acids 1000 MG capsule Take 1 g by mouth daily.  Marland Kitchen ibuprofen (ADVIL,MOTRIN) 200 MG tablet Take 400 mg by mouth every 6 (six) hours as needed for mild pain or moderate pain.  . Multiple Vitamin (MULTIVITAMIN WITH MINERALS) TABS Take 1 tablet by mouth daily.    Allergies: No Known Allergies  No family history on file.  Social History:  reports that he has never smoked. He does not have any smokeless tobacco history on file. He reports that he drinks alcohol. He reports that he does not use drugs.  ROS: A complete review of systems was performed.  All systems are negative except for pertinent findings as noted.  Physical Exam:  Vital signs in last 24 hours: Temp:  [98.9 F (37.2 C)-103 F (39.4 C)] 103 F (39.4 C) (06/06 1556) Pulse Rate:  [106-137] 114 (06/06 1556) Resp:  [18-20] 20 (06/06 1556) BP: (111-161)/(71-110) 140/84 (06/06 1556) SpO2:  [93 %-100 %] 99 % (06/06 1556) Weight:  [113.4 kg (250 lb)-114.3 kg (251 lb 15.8 oz)] 114.3 kg (251 lb 15.8 oz) (06/06 1556) Constitutional:  Alert and oriented, No acute  distress Cardiovascular: Regular rate and rhythm, No JVD Respiratory: Normal respiratory effort, Lungs clear bilaterally GI: Abdomen is soft, nontender, nondistended, no abdominal masses Genitourinary: No CVAT. He has significant penoscrotal edema.  His scrotum is fluctuant bilaterally more so on the right.  There is no significant drainage.  No crepitus or erythema. Perineum is normal. Rectal: Normal sphincter tone, no rectal masses, prostate is non tender and without nodularity. Prostate size is estimated to be 40 cc Lymphatic: No lymphadenopathy Neurologic: Grossly intact, no focal deficits Psychiatric: Normal mood and affect  Laboratory Data:   Recent Labs  12/13/16 0940  WBC 16.3*  HGB 14.5  HCT 41.9  PLT 162     Recent Labs  12/13/16 0940  NA 132*  K 3.7  CL 99*  GLUCOSE 181*  BUN 15  CALCIUM 8.4*  CREATININE 2.55*     Results for orders placed or performed during the hospital encounter of 12/13/16 (from the past 24 hour(s))  Comprehensive metabolic panel     Status: Abnormal   Collection Time: 12/13/16  9:40 AM  Result Value Ref Range   Sodium 132 (L) 135 - 145 mmol/L   Potassium 3.7 3.5 - 5.1 mmol/L   Chloride 99 (L) 101 - 111 mmol/L   CO2 19 (L) 22 - 32 mmol/L   Glucose, Bld 181 (H) 65 - 99 mg/dL   BUN 15 6 - 20 mg/dL   Creatinine, Ser 1.61 (H) 0.61 - 1.24 mg/dL   Calcium 8.4 (L) 8.9 - 10.3 mg/dL   Total  Protein 7.8 6.5 - 8.1 g/dL   Albumin 3.2 (L) 3.5 - 5.0 g/dL   AST 39 15 - 41 U/L   ALT 22 17 - 63 U/L   Alkaline Phosphatase 61 38 - 126 U/L   Total Bilirubin 2.1 (H) 0.3 - 1.2 mg/dL   GFR calc non Af Amer 28 (L) >60 mL/min   GFR calc Af Amer 33 (L) >60 mL/min   Anion gap 14 5 - 15  CBC with Differential     Status: Abnormal   Collection Time: 12/13/16  9:40 AM  Result Value Ref Range   WBC 16.3 (H) 4.0 - 10.5 K/uL   RBC 4.57 4.22 - 5.81 MIL/uL   Hemoglobin 14.5 13.0 - 17.0 g/dL   HCT 16.141.9 09.639.0 - 04.552.0 %   MCV 91.7 78.0 - 100.0 fL   MCH 31.7  26.0 - 34.0 pg   MCHC 34.6 30.0 - 36.0 g/dL   RDW 40.913.4 81.111.5 - 91.415.5 %   Platelets 162 150 - 400 K/uL   Neutrophils Relative % 83 %   Lymphocytes Relative 6 %   Monocytes Relative 11 %   Eosinophils Relative 0 %   Basophils Relative 0 %   Neutro Abs 13.5 (H) 1.7 - 7.7 K/uL   Lymphs Abs 1.0 0.7 - 4.0 K/uL   Monocytes Absolute 1.8 (H) 0.1 - 1.0 K/uL   Eosinophils Absolute 0.0 0.0 - 0.7 K/uL   Basophils Absolute 0.0 0.0 - 0.1 K/uL   WBC Morphology MILD LEFT SHIFT (1-5% METAS, OCC MYELO, OCC BANDS)   I-Stat CG4 Lactic Acid, ED     Status: Abnormal   Collection Time: 12/13/16  9:48 AM  Result Value Ref Range   Lactic Acid, Venous 2.95 (HH) 0.5 - 1.9 mmol/L   Comment NOTIFIED PHYSICIAN   Urinalysis, Routine w reflex microscopic     Status: Abnormal   Collection Time: 12/13/16 11:39 AM  Result Value Ref Range   Color, Urine AMBER (A) YELLOW   APPearance HAZY (A) CLEAR   Specific Gravity, Urine 1.016 1.005 - 1.030   pH 5.0 5.0 - 8.0   Glucose, UA NEGATIVE NEGATIVE mg/dL   Hgb urine dipstick LARGE (A) NEGATIVE   Bilirubin Urine NEGATIVE NEGATIVE   Ketones, ur NEGATIVE NEGATIVE mg/dL   Protein, ur 782100 (A) NEGATIVE mg/dL   Nitrite NEGATIVE NEGATIVE   Leukocytes, UA NEGATIVE NEGATIVE   RBC / HPF 0-5 0 - 5 RBC/hpf   WBC, UA 0-5 0 - 5 WBC/hpf   Bacteria, UA RARE (A) NONE SEEN   Squamous Epithelial / LPF 0-5 (A) NONE SEEN   Mucous PRESENT    Hyaline Casts, UA PRESENT    Amorphous Crystal PRESENT   I-Stat CG4 Lactic Acid, ED     Status: None   Collection Time: 12/13/16 12:43 PM  Result Value Ref Range   Lactic Acid, Venous 1.14 0.5 - 1.9 mmol/L   No results found for this or any previous visit (from the past 240 hour(s)).  Renal Function:  Recent Labs  12/13/16 0940  CREATININE 2.55*   Estimated Creatinine Clearance: 45.5 mL/min (A) (by C-G formula based on SCr of 2.55 mg/dL (H)).  Radiologic Imaging: Dg Chest 2 View  Result Date: 12/13/2016 CLINICAL DATA:  Fever.  Scrotal  swelling for 2 days. EXAM: CHEST  2 VIEW COMPARISON:  05/04/2009 FINDINGS: Normal heart size and mediastinal contours. No acute infiltrate or edema. No effusion or pneumothorax. Spondylosis. No acute osseous findings. IMPRESSION: Negative chest. Electronically  Signed   By: Marnee Spring M.D.   On: 12/13/2016 10:38   US Scrotum  Result Date: 12/13/2016 CLINICAL DATA:  Scrotal pain and swelling for the past 3 days with no known trauma. EXAM: SCROTAL ULTRASOUND DOPPLER ULTRASOUND OF THE TESTICLES TECHNIQUE: Complete ultrasound examination of the testicles, epididymis, and other scrotal structures was performed. Color and spectral Doppler ultrasound were also utilized to evaluate blood flow to the testicles. COMPARISON:  None in PACs FINDINGS: Right testicle Measurements: 3.0 x 1.9 x 2.7 cm. No mass or microlithiasis visualized. Left testicle Measurements: 2.8 x 2.0 x 2.7 cm. No mass or microlithiasis visualized. Right epididymis: The right epididymis is subjectively enlarged. Vascularity appears normal. Left epididymis: The left epididymis is subjectively enlarged. Vascularity appears normal. Hydrocele:  None visualized. Varicocele:  There is a left-sided varicocele. Pulsed Doppler interrogation of both testes demonstrates normal low resistance arterial and venous waveforms bilaterally. IMPRESSION: Normal echotexture and vascularity of the testes. Prominent epididymal structures bilaterally which may reflect epididymitis. Vascularity is not significantly increased however. No hydrocele.  Moderate size left-sided varicocele. Electronically Signed   By: David  [place] M.D.   On: 12/13/2016 11:02   [place] Art/ven Flow Abd Pelv Doppler  Result Date: 12/13/2016 CLINICAL DATA:  Scrotal pain and swelling for the past 3 days with no known trauma. EXAM: SCROTAL ULTRASOUND DOPPLER ULTRASOUND OF THE TESTICLES TECHNIQUE: Complete ultrasound examination of the testicles, epididymis, and other scrotal structures was performed.  Color and spectral Doppler ultrasound were also utilized to evaluate blood flow to the testicles. COMPARISON:  None in PACs FINDINGS: Right testicle Measurements: 3.0 x 1.9 x 2.7 cm. No mass or microlithiasis visualized. Left testicle Measurements: 2.8 x 2.0 x 2.7 cm. No mass or microlithiasis visualized. Right epididymis: The right epididymis is subjectively enlarged. Vascularity appears normal. Left epididymis: The left epididymis is subjectively enlarged. Vascularity appears normal. Hydrocele:  None visualized. Varicocele:  There is a left-sided varicocele. Pulsed Doppler interrogation of both testes demonstrates normal low resistance arterial and venous waveforms bilaterally. IMPRESSION: Normal echotexture and vascularity of the testes. Prominent epididymal structures bilaterally which may reflect epididymitis. Vascularity is not significantly increased however. No hydrocele.  Moderate size left-sided varicocele. Electronically Signed   By: David  [place] M.D.   On: 12/13/2016 11:02    Impression/Assessment:  Scrotal abscess  Plan:  I have recommended incision and drainage of his scrotal abscess.  He will be admitted to the hospital for IV antibiotics with cultures obtained at the time of his procedure.  We have reviewed the potential risks and complications as well as the expected recovery process associated with this procedure.  This will be performed later this evening.  His lactic acid levels of now normalized after IV fluid hydration in the emergency department.  I will continue to follow his renal function.  Jamael Hoffmann,LES 12/13/2016, 4:02 PM  Moody Bruins. MD

## 2016-12-13 NOTE — ED Triage Notes (Signed)
Patient complains of genital pain and swelling x 3 days. Denies trauma. Fever on arrival. No urinary symptoms. Nol trouble urinating

## 2016-12-13 NOTE — Discharge Instructions (Addendum)
Continue performing wet to dry saline dressing changes as instructed in the hospital.  Call if fever > 101.

## 2016-12-13 NOTE — ED Notes (Signed)
Patient transported to X-ray and US 

## 2016-12-13 NOTE — Op Note (Signed)
Preoperative diagnosis: Scrotal abscess  Postoperative diagnosis: Scrotal abscess  Procedure: Incision and drainage of scrotal abscess  Surgeon: Dr. Rolly SalterLester S Erla Bacchi, Montez HagemanJr.  Anesthesia: General  Complications: None  Estimated blood loss: Minimal  Specimens: Aerobic and anaerobic cultures of scrotal abscess sent to microbiology  Drains: Quarter-inch Penrose  Indication: Mr. Stephen Knight is a 51 year old gentleman who presented to the emergency department with a large right hemiscrotal abscess.  He was recommended that he undergo surgical incision and drainage.  The potential risks, complications, and expected recovery process were discussed with the patient in detail and informed consent was obtained.  Description of procedure: The patient was taken to the operating room and a general anesthetic was administered.  He had been administered broad spectrum IV antibiotics in the emergency department.  He was placed in the dorsal lithotomy position and prepped and draped in the usual sterile fashion.  A preoperative timeout was performed.  The patient's scrotum was examined.  There was fluctuance noted most predominantly in the anterior upper right hemiscrotum.  An incision was made with a 15 blade in this area and pus was encountered.  Cultures of the scrotal abscess were obtained.  This was then opened a total of approximately 4 cm and finger dissection of the scrotum was performed to open all pockets of the abscess.  Once there was confidence that the abscess was completely drained, a pulse lavage was used to irrigate the wound with 3 L of sterile saline.  A Penrose drain was then placed through a separate stab incision in the right lateral scrotum.  A wet-to-dry Kerlix dressing was placed into the open scrotal abscess cavity.  2-0 chromic horizontal mattress sutures were then loosely placed in the scrotal opening for reapproximation.  ABDs were placed over the scrotal wound and mesh underwear was placed  to hold the dressing in place.  The patient tolerated the procedure well without complications.  He was able to be awakened and transferred to recovery unit in satisfactory condition.

## 2016-12-13 NOTE — ED Provider Notes (Signed)
Medical screening examination/treatment/procedure(s) were conducted as a shared visit with non-physician practitioner(s) and myself.  I personally evaluated the patient during the encounter.   EKG Interpretation None     Patient noted swelling 2 days ago. He reports this started on the shaft of the penis and he thought he might have a boil although he denies visualizing a specific lesion. This progressed to very large swelling of his testicles and pubis. He reports now this is very painful. He reports the penis itself is not so painful somewhat shows the large swelling of the scrotum and the pubis. He reports he can continue to urinate. He reports he is sexually active although reports same partner doubts STD. Denies any other medical problems.  Patient is alert and nontoxic. Abdominal examination soft and nontender. He does not have pain to palpation over the bladder. He however has very large edematous swelling of the pubis and testicles as well as penis. Testicles are firm and tense. The swelling does not extend into the perineum. Patient has very tender palpation of the testicles and the pubis. Penis is less tender to palpation. I am able to to reduce edema of the neck of the glans. Do not believe there is paraphimosis but phimosis present.  Patient was fever, lactic acidosis, white count and tachycardia. Sepsis protocol will be initiated. I agree with plan and management.   Arby BarrettePfeiffer, Glennis Montenegro, MD 12/13/16 1011

## 2016-12-13 NOTE — ED Notes (Signed)
[id number]; Dr. Heloise PurpuraLester Borden receiving

## 2016-12-14 ENCOUNTER — Encounter (HOSPITAL_COMMUNITY): Payer: Self-pay | Admitting: Urology

## 2016-12-14 LAB — URINE CULTURE: Culture: NO GROWTH

## 2016-12-14 LAB — BASIC METABOLIC PANEL
Anion gap: 10 (ref 5–15)
BUN: 12 mg/dL (ref 6–20)
CALCIUM: 7.9 mg/dL — AB (ref 8.9–10.3)
CO2: 25 mmol/L (ref 22–32)
CREATININE: 1.4 mg/dL — AB (ref 0.61–1.24)
Chloride: 106 mmol/L (ref 101–111)
GFR calc Af Amer: >60 mL/min (ref >60)
GFR, EST NON AFRICAN AMERICAN: 58 mL/min — AB (ref >60)
GLUCOSE: 105 mg/dL — AB (ref 65–99)
POTASSIUM: 4 mmol/L (ref 3.5–5.1)
SODIUM: 141 mmol/L (ref 135–145)

## 2016-12-14 LAB — HIV ANTIBODY (ROUTINE TESTING W REFLEX): HIV SCREEN 4TH GENERATION: NONREACTIVE

## 2016-12-14 LAB — CBC
HCT: 37.6 % — ABNORMAL LOW (ref 39.0–52.0)
Hemoglobin: 12.8 g/dL — ABNORMAL LOW (ref 13.0–17.0)
MCH: 31.5 pg (ref 26.0–34.0)
MCHC: 34 g/dL (ref 30.0–36.0)
MCV: 92.6 fL (ref 78.0–100.0)
Platelets: 155 10*3/uL (ref 150–400)
RBC: 4.06 MIL/uL — ABNORMAL LOW (ref 4.22–5.81)
RDW: 13.7 % (ref 11.5–15.5)
WBC: 13.3 10*3/uL — ABNORMAL HIGH (ref 4.0–10.5)

## 2016-12-14 LAB — HSV(HERPES SMPLX)ABS-I+II(IGG+IGM)-BLD
HSV 1 GLYCOPROTEIN G AB, IGG: 48.3 {index} — AB (ref 0.00–0.90)
HSV 2 GLYCOPROTEIN G AB, IGG: 1.41 {index} — AB (ref 0.00–0.90)
HSVI/II Comb IgM: <0.91 Ratio (ref 0.00–0.90)

## 2016-12-14 LAB — GC/CHLAMYDIA PROBE AMP (~~LOC~~) NOT AT ARMC
Chlamydia: NEGATIVE
NEISSERIA GONORRHEA: NEGATIVE

## 2016-12-14 NOTE — Care Management Note (Signed)
Case Management Note  Patient Details  Name: Stephen BostonJohn Knight MRN: [id number] Date of Birth: 1966-03-17  Subjective/Objective:  51 y/o m admitted w/scrotal abscess. From home.                  Action/Plan:d/c plan home.   Expected Discharge Date:                  Expected Discharge Plan:  Home/Self Care  In-House Referral:     Discharge planning Services  CM Consult  Post Acute Care Choice:    Choice offered to:     DME Arranged:    DME Agency:     HH Arranged:    HH Agency:     Status of Service:  In process, will continue to follow  If discussed at Long Length of Stay Meetings, dates discussed:    Additional Comments:  Lanier ClamMahabir, Aman Batley, RN 12/14/2016, 2:15 PM

## 2016-12-14 NOTE — Progress Notes (Signed)
Patient ID: Stephen BostonJohn Knight, male   DOB: Aug 10, 1965, [age] y.o.   MRN: [id number]   I removed his scrotal dressing today.  The inferior aspect of his incision will be left open to drain.  He also has a lateral Penrose drain and will also be left to drainage. He is clinically improving.  Cultures remain pending.

## 2016-12-15 LAB — BASIC METABOLIC PANEL
ANION GAP: 7 (ref 5–15)
BUN: 11 mg/dL (ref 6–20)
CHLORIDE: 105 mmol/L (ref 101–111)
CO2: 25 mmol/L (ref 22–32)
Calcium: 7.8 mg/dL — ABNORMAL LOW (ref 8.9–10.3)
Creatinine, Ser: 1.13 mg/dL (ref 0.61–1.24)
GFR calc non Af Amer: >60 mL/min (ref >60)
Glucose, Bld: 123 mg/dL — ABNORMAL HIGH (ref 65–99)
Potassium: 3.3 mmol/L — ABNORMAL LOW (ref 3.5–5.1)
Sodium: 137 mmol/L (ref 135–145)

## 2016-12-15 LAB — CBC
HEMATOCRIT: 34.6 % — AB (ref 39.0–52.0)
HEMOGLOBIN: 12 g/dL — AB (ref 13.0–17.0)
MCH: 31.4 pg (ref 26.0–34.0)
MCHC: 34.7 g/dL (ref 30.0–36.0)
MCV: 90.6 fL (ref 78.0–100.0)
Platelets: 153 10*3/uL (ref 150–400)
RBC: 3.82 MIL/uL — ABNORMAL LOW (ref 4.22–5.81)
RDW: 13.7 % (ref 11.5–15.5)
WBC: 12.7 10*3/uL — AB (ref 4.0–10.5)

## 2016-12-15 MED ORDER — VANCOMYCIN HCL IN DEXTROSE 1-5 GM/200ML-% IV SOLN
1000.0000 mg | Freq: Two times a day (BID) | INTRAVENOUS | Status: DC
  Administered 2016-12-15 – 2016-12-17 (×4): 1000 mg via INTRAVENOUS
  Filled 2016-12-15 (×2): qty 200

## 2016-12-15 NOTE — Progress Notes (Signed)
Patient ID: Stephen BostonJohn Knight, male   DOB: January 02, 1966, 51 y.o.   MRN: [id number]  Pt doing well.  Culture from OR positive for Staph aureus.  Sensitivities still pending.   PE  Scrotum still edematous.  Has a small area of purulent drainage on top of penile shaft but no fluctuant areas.  Incision is still loosely approximated and draining.  Penrose in place.  No other areas that appear needing to be drained (he was aggressively drained on Wednesday night)  Plan:  Change to IV vancomycin and await final sensitivities D/C home once sensitivities are back I will arrange follow up next week for wound check and for possible drain removal

## 2016-12-15 NOTE — Progress Notes (Signed)
Patient ID: Stephen BostonJohn Wiegman, male   DOB: 1966/06/14, 51 y.o.   MRN: [id number]  2 Days Post-Op Subjective: Pt overall improved.  He is eating well and pain is controlled.  He is still concerned about the scrotal swelling.  Objective: Vital signs in last 24 hours: Temp:  [99.7 F (37.6 C)-100.7 F (38.2 C)] 99.8 F (37.7 C) (06/08 0515) Pulse Rate:  [93-102] 93 (06/08 0515) Resp:  [18-20] 20 (06/08 0515) BP: (110-119)/(69-81) 119/78 (06/08 0515) SpO2:  [97 %-99 %] 98 % (06/08 0515)  Intake/Output from previous day: 06/07 0701 - 06/08 0700 In: 2352.5 [I.V.:2302.5; IV Piggyback:50] Out: 800 [Urine:800] Intake/Output this shift: No intake/output data recorded.  Physical Exam:  General: Alert and oriented Abdomen: Soft, ND GU: Moderate penoscrotal swelling.  His drain remains in place.  He continues to have induration of the posterior scrotum without fluctuance.  His incision continues to drain as expected and hoped for. Ext: NT, No erythema  Lab Results:  Recent Labs  12/13/16 1625 12/14/16 0546 12/15/16 0426  HGB 12.2* 12.8* 12.0*  HCT 35.2* 37.6* 34.6*   CBC Latest Ref Rng & Units 12/15/2016 12/14/2016 12/13/2016  WBC 4.0 - 10.5 K/uL 12.7(H) 13.3(H) 15.2(H)  Hemoglobin 13.0 - 17.0 g/dL 12.0(L) 12.8(L) 12.2(L)  Hematocrit 39.0 - 52.0 % 34.6(L) 37.6(L) 35.2(L)  Platelets 150 - 400 K/uL 153 155 162     BMET  Recent Labs  12/14/16 0546 12/15/16 0426  NA 141 137  K 4.0 3.3*  CL 106 105  CO2 25 25  GLUCOSE 105* 123*  BUN 12 11  CREATININE 1.40* 1.13  CALCIUM 7.9* 7.8*     Studies/Results:  Cultures: GS with [facility], awaiting final ID and sensitivities  Assessment/Plan: Scrotal abscess:  Pt continues to improve as expected.  Will continue current IV antibiotics pending final cultures and sensitivities (maybe today or tomorrow).  He can then be discharged and will need about 2 weeks of appropriate oral antibiotic therapy.  Will arrange for f/u next week for possible drain  removal and reassessment of wound. I reassured him that he will continues to have swelling for a few weeks and that is expected.    LOS: 2 days   Tyrica Afzal,LES 12/15/2016, 8:56 AM

## 2016-12-15 NOTE — Anesthesia Postprocedure Evaluation (Signed)
Anesthesia Post Note  Patient: Stephen BostonJohn Knight  Procedure(s) Performed: Procedure(s) (LRB): INCISION AND DRAINAGE OF SCROTAL ABSCESS (N/A)     Patient location during evaluation: PACU Anesthesia Type: General Level of consciousness: awake and alert Pain management: pain level controlled Vital Signs Assessment: post-procedure vital signs reviewed and stable Respiratory status: spontaneous breathing, nonlabored ventilation, respiratory function stable and patient connected to nasal cannula oxygen Cardiovascular status: blood pressure returned to baseline and stable Postop Assessment: no signs of nausea or vomiting Anesthetic complications: no    Last Vitals:  Vitals:   12/15/16 0257 12/15/16 0515  BP:  119/78  Pulse:  93  Resp:  20  Temp: 37.6 C 37.7 C    Last Pain:  Vitals:   12/15/16 0835  TempSrc:   PainSc: 10-Worst pain ever                 Phillips Groutarignan, Trent Theisen

## 2016-12-15 NOTE — Progress Notes (Signed)
Temp 100.4 patient refuses tylenol. Dr. Laverle PatterBorden aware.

## 2016-12-15 NOTE — Discharge Summary (Signed)
.   Date of admission: 12/13/2016  Date of discharge: 12/20/16  Admission diagnosis: Scrotal abscess, AKI  Discharge diagnosis: Same  Secondary diagnoses: None  History and Physical: For full details, please see admission history and physical. Briefly, Stephen Knight is a 51 y.o. year old patient with who presented to the ED with scrotal swelling and pain and fever.  He was also noted to have an elevated Cr.  Hospital Course: He received fluid resuscitation in the ED.  He was noted to have a large scrotal abscess on exam and was taken to the OR for I and D that evening with a large amount of purulent drainage noted.  He underwent an aggressive I and D and irrigation of his wound.  The incision was loosely approximated and a Penrose drain was left in place.  He continued IV antibiotics which were changed to Vancomycin on POD #2 when the bacterial culture from the OR was ID'd as Staph aureus.  His WBC continued to improve and his renal function returned to normal after IVF hydration.   Ultimately his culture was positive for MRSA and he was transitioned to po Bactrim DS.  He had persistent fever and a rising WBC by 6/11 and was taken back to the OR and was noted to have multiple recurrent pockets of abscess requiring more extensive irrigation and debridement with further opening of his scrotal wound.  He underwent further wound care with wet to dry dressing changes and was able to be discharged home on 12/20/16 in good condition with plans for continued wound care and oral antibiotic therapy.  Laboratory values:  Recent Labs  12/13/16 1625 12/14/16 0546 12/15/16 0426  HGB 12.2* 12.8* 12.0*  HCT 35.2* 37.6* 34.6*    Recent Labs  12/14/16 0546 12/15/16 0426  CREATININE 1.40* 1.13    Disposition: Home  Discharge instruction: The patient was instructed to be ambulatory but told to refrain from heavy lifting, strenuous activity, or driving.  Discharge medications:  Allergies as of 12/15/2016   No  Known Allergies     Medication Knight    STOP taking these medications   acetaminophen-codeine 300-30 MG tablet Commonly known as:  TYLENOL #3   fish oil-omega-3 fatty acids 1000 MG capsule   ibuprofen 200 MG tablet Commonly known as:  ADVIL,MOTRIN   ibuprofen 800 MG tablet Commonly known as:  ADVIL,MOTRIN   multivitamin with minerals Tabs tablet     TAKE these medications   HYDROcodone-acetaminophen 5-325 MG tablet Commonly known as:  NORCO/VICODIN Take 2 tablets by mouth every 4 (four) hours as needed for pain. What changed:  Another medication with the same name was added. Make sure you understand how and when to take each.   HYDROcodone-acetaminophen 5-325 MG tablet Commonly known as:  NORCO/VICODIN Take 1-2 tablets by mouth every 6 (six) hours as needed. What changed:  You were already taking a medication with the same name, and this prescription was added. Make sure you understand how and when to take each.       Followup:  Follow-up Information    [facility].   Why:  Will call to schedule Contact information: [address] [place] [id number] [phone]         He will come in for a wound check on Friday 12/22/16.

## 2016-12-16 LAB — CBC
HCT: 33.4 % — ABNORMAL LOW (ref 39.0–52.0)
HEMOGLOBIN: 11.7 g/dL — AB (ref 13.0–17.0)
MCH: 32 pg (ref 26.0–34.0)
MCHC: 35 g/dL (ref 30.0–36.0)
MCV: 91.3 fL (ref 78.0–100.0)
Platelets: 221 10*3/uL (ref 150–400)
RBC: 3.66 MIL/uL — AB (ref 4.22–5.81)
RDW: 13.9 % (ref 11.5–15.5)
WBC: 13.3 10*3/uL — AB (ref 4.0–10.5)

## 2016-12-16 NOTE — Progress Notes (Signed)
Patient ID: Stephen Knight, male   DOB: 1966/02/27, 51 y.o.   MRN: [id number]   I am here placing a stent noticed patient T 102.9. He has no complaints.   PE: He looks well. No much change in his exam. Scrotum still edematous. Top of penile shaft non-fluctuant, but has some drainage now - a small spontaneous drainage has appeared through a small hole. The surrounding skin is healthy and viable. ML scrotal incision is still loosely approximated and draining serosanguinous fluid. More inferiorly I noted some purulent draining from a new opening. Penrose in place.   I cut the chromics approximating the suture and put a finger in to open up the cavity and I then went inferiorly and broke up the cavity draining pus. I also went up toward the penis. I packed the cavities with wet to dry.   Assessment/Plan:  On IV vancomycin -- Cx + for MRSA sensitive to Vanc, I'll add zosyn and clinda for now. Check labs.  Change dressing tomorrow F/u next week for wound check and for possible drain removal

## 2016-12-16 NOTE — Progress Notes (Signed)
Pt temp 100.8 @ 0520. Pt refused Tylenol.

## 2016-12-16 NOTE — Plan of Care (Signed)
Problem: Pain Managment: Goal: General experience of comfort will improve Outcome: Not Progressing Pt rates his pain at an 8. Refuses pain meds.

## 2016-12-16 NOTE — Progress Notes (Deleted)
Pt stating that he is only in pain when he gets OOB and moves around. I explained to pt that he needs to ambulate and suggested he accept pain meds to make ambulation more tolerable. Pt refused pain meds and refused to ambulate because he was watching a game on TV.

## 2016-12-16 NOTE — Progress Notes (Signed)
   Pt c/o scrotal drainage. He's been up walking. Showered today. Culture from OR positive for Staph aureus.  Sensitivities still pending. On Vanc. WBC stable, Tm 100.8 - pt has been 99-100.   PE:  Scrotum still edematous. Top of penile shaft non-fluctuant today and no drainage. ML scrotal incision is still loosely approximated and draining serosanguinous fluid.  Penrose in place.   Plan:  On IV vancomycin and await final sensitivities D/C home once sensitivities are back I will arrange follow up next week for wound check and for possible drain removal

## 2016-12-16 NOTE — Plan of Care (Signed)
Problem: Pain Managment: Goal: General experience of comfort will improve Outcome: Not Progressing Pt is refusing pain meds. He states that he only has pain when he is up moving around. I encouraged pt to get OOB and ambulate, and to utilize his pain meds to help w/ his pain. He refused

## 2016-12-17 LAB — CBC WITH DIFFERENTIAL/PLATELET
BASOS ABS: 0 10*3/uL (ref 0.0–0.1)
Basophils Relative: 0 %
EOS ABS: 0 10*3/uL (ref 0.0–0.7)
Eosinophils Relative: 0 %
HEMATOCRIT: 34.1 % — AB (ref 39.0–52.0)
Hemoglobin: 11.9 g/dL — ABNORMAL LOW (ref 13.0–17.0)
LYMPHS ABS: 2.2 10*3/uL (ref 0.7–4.0)
Lymphocytes Relative: 15 %
MCH: 31.8 pg (ref 26.0–34.0)
MCHC: 34.9 g/dL (ref 30.0–36.0)
MCV: 91.2 fL (ref 78.0–100.0)
MONO ABS: 1.7 10*3/uL — AB (ref 0.1–1.0)
Monocytes Relative: 12 %
NEUTROS ABS: 10.5 10*3/uL — AB (ref 1.7–7.7)
Neutrophils Relative %: 73 %
PLATELETS: 280 10*3/uL (ref 150–400)
RBC: 3.74 MIL/uL — AB (ref 4.22–5.81)
RDW: 13.9 % (ref 11.5–15.5)
WBC: 14.4 10*3/uL — AB (ref 4.0–10.5)

## 2016-12-17 LAB — BASIC METABOLIC PANEL
Anion gap: 9 (ref 5–15)
BUN: 12 mg/dL (ref 6–20)
CALCIUM: 8.8 mg/dL — AB (ref 8.9–10.3)
CO2: 28 mmol/L (ref 22–32)
CREATININE: 1.01 mg/dL (ref 0.61–1.24)
Chloride: 105 mmol/L (ref 101–111)
Glucose, Bld: 120 mg/dL — ABNORMAL HIGH (ref 65–99)
Potassium: 3.7 mmol/L (ref 3.5–5.1)
SODIUM: 142 mmol/L (ref 135–145)

## 2016-12-17 MED ORDER — SULFAMETHOXAZOLE-TRIMETHOPRIM 800-160 MG PO TABS
1.0000 | ORAL_TABLET | Freq: Two times a day (BID) | ORAL | Status: DC
  Administered 2016-12-17 – 2016-12-20 (×7): 1 via ORAL
  Filled 2016-12-17 (×6): qty 1

## 2016-12-17 NOTE — Progress Notes (Addendum)
4 Days Post-Op   Assessment/Plan:  S/p I&D scrotal abscess with fever last night. Clinically improved with incision open and packed. Discussed with ID and will plan po Bactrim (wound cx MRSA). If remains stable likely home in AM.  Interesting, I ordered Zosyn and clinda last night but don't see any record of the med given or the orders.   Subjective: Patient reports no complaints. No significant pain.   Objective: Vital signs in last 24 hours: Temp:  [99.4 F (37.4 C)-102.9 F (39.4 C)] 99.4 F (37.4 C) (06/10 0526) Pulse Rate:  [79-93] 79 (06/10 0526) Resp:  [18] 18 (06/10 0526) BP: (116-135)/(67-78) 116/68 (06/10 0526) SpO2:  [98 %-99 %] 98 % (06/10 0526)  Intake/Output from previous day: 06/09 0701 - 06/10 0700 In: 720 [P.O.:720] Out: 2100 [Urine:2100] Intake/Output this shift: No intake/output data recorded.  Physical Exam:  Scrotum still edematous, but maybe a little softer. The purulent drainage on top of penile shaft has resolved. No fluctuance or induration here. Scrotal wound wet to dry change. Looks better. Penrose in place. Inferior scrotum and perineum indurated but no fluctuance or crepitus noted on exam.   Lab Results:  Recent Labs  12/15/16 0426 12/16/16 0635 12/17/16 0548  HGB 12.0* 11.7* 11.9*  HCT 34.6* 33.4* 34.1*   BMET  Recent Labs  12/15/16 0426 12/17/16 0548  NA 137 142  K 3.3* 3.7  CL 105 105  CO2 25 28  GLUCOSE 123* 120*  BUN 11 12  CREATININE 1.13 1.01  CALCIUM 7.8* 8.8*   No results for input(s): LABPT, INR in the last 72 hours. No results for input(s): LABURIN in the last 72 hours. Results for orders placed or performed during the hospital encounter of 12/13/16  Blood Culture (routine x 2)     Status: None (Preliminary result)   Collection Time: 12/13/16  9:50 AM  Result Value Ref Range Status   Specimen Description BLOOD LEFT ANTECUBITAL  Final   Special Requests IN PEDIATRIC BOTTLE Blood Culture adequate volume  Final   Culture NO GROWTH 3 DAYS  Final   Report Status PENDING  Incomplete  Blood Culture (routine x 2)     Status: None (Preliminary result)   Collection Time: 12/13/16  9:53 AM  Result Value Ref Range Status   Specimen Description BLOOD RIGHT FOREARM  Final   Special Requests   Final    BOTTLES DRAWN AEROBIC AND ANAEROBIC Blood Culture adequate volume   Culture NO GROWTH 3 DAYS  Final   Report Status PENDING  Incomplete  Urine culture     Status: None   Collection Time: 12/13/16 11:39 AM  Result Value Ref Range Status   Specimen Description URINE, RANDOM  Final   Special Requests NONE  Final   Culture NO GROWTH  Final   Report Status 12/14/2016 FINAL  Final  MRSA PCR Screening     Status: None   Collection Time: 12/13/16  4:12 PM  Result Value Ref Range Status   MRSA by PCR NEGATIVE NEGATIVE Final    Comment:        The GeneXpert MRSA Assay (FDA approved for NASAL specimens only), is one component of a comprehensive MRSA colonization surveillance program. It is not intended to diagnose MRSA infection nor to guide or monitor treatment for MRSA infections.   Aerobic/Anaerobic Culture (surgical/deep wound)     Status: None (Preliminary result)   Collection Time: 12/13/16  8:07 PM  Result Value Ref Range Status   Specimen Description  SCROTUM INCISION AND DRAINAGE OF SCROTAL ABCESS  Final   Special Requests NONE  Final   Gram Stain   Final    RARE WBC PRESENT, PREDOMINANTLY PMN FEW GRAM POSITIVE COCCI IN PAIRS IN CLUSTERS Performed at [facility], [address] [place], [place] [address]    Culture   Final    ABUNDANT METHICILLIN RESISTANT STAPHYLOCOCCUS AUREUS NO ANAEROBES ISOLATED; CULTURE IN PROGRESS FOR 5 DAYS    Report Status PENDING  Incomplete   Organism ID, Bacteria METHICILLIN RESISTANT STAPHYLOCOCCUS AUREUS  Final      Susceptibility   Methicillin resistant staphylococcus aureus - MIC*    CIPROFLOXACIN <=0.5 SENSITIVE Sensitive     ERYTHROMYCIN >=8  RESISTANT Resistant     GENTAMICIN <=0.5 SENSITIVE Sensitive     OXACILLIN >=4 RESISTANT Resistant     TETRACYCLINE <=1 SENSITIVE Sensitive     VANCOMYCIN <=0.5 SENSITIVE Sensitive     TRIMETH/SULFA <=10 SENSITIVE Sensitive     CLINDAMYCIN <=0.25 SENSITIVE Sensitive     RIFAMPIN <=0.5 SENSITIVE Sensitive     Inducible Clindamycin NEGATIVE Sensitive     * ABUNDANT METHICILLIN RESISTANT STAPHYLOCOCCUS AUREUS    Studies/Results: No results found.    LOS: 4 days   Theresia Pree 12/17/2016, 11:34 AM

## 2016-12-18 ENCOUNTER — Encounter (HOSPITAL_COMMUNITY)
Admission: EM | Disposition: A | Discharge status: Home / Self Care | Payer: Self-pay | Source: Home / Self Care | Attending: Urology

## 2016-12-18 ENCOUNTER — Encounter (HOSPITAL_COMMUNITY): Payer: Self-pay | Admitting: Certified Registered Nurse Anesthetist

## 2016-12-18 ENCOUNTER — Inpatient Hospital Stay (HOSPITAL_COMMUNITY): Payer: Self-pay | Admitting: Certified Registered Nurse Anesthetist

## 2016-12-18 HISTORY — PX: SCROTAL EXPLORATION: SHX2386

## 2016-12-18 LAB — CBC
HCT: 34.3 % — ABNORMAL LOW (ref 39.0–52.0)
Hemoglobin: 12.1 g/dL — ABNORMAL LOW (ref 13.0–17.0)
MCH: 32.3 pg (ref 26.0–34.0)
MCHC: 35.3 g/dL (ref 30.0–36.0)
MCV: 91.5 fL (ref 78.0–100.0)
PLATELETS: 370 10*3/uL (ref 150–400)
RBC: 3.75 MIL/uL — ABNORMAL LOW (ref 4.22–5.81)
RDW: 14.2 % (ref 11.5–15.5)
WBC: 14.3 10*3/uL — ABNORMAL HIGH (ref 4.0–10.5)

## 2016-12-18 LAB — CULTURE, BLOOD (ROUTINE X 2)
Culture: NO GROWTH
Culture: NO GROWTH
SPECIAL REQUESTS: ADEQUATE
Special Requests: ADEQUATE

## 2016-12-18 SURGERY — EXPLORATION, SCROTUM
Anesthesia: General

## 2016-12-18 MED ORDER — HYDROMORPHONE HCL 1 MG/ML IJ SOLN
INTRAMUSCULAR | Status: AC
  Filled 2016-12-18: qty 1

## 2016-12-18 MED ORDER — ONDANSETRON HCL 4 MG/2ML IJ SOLN
INTRAMUSCULAR | Status: AC
  Filled 2016-12-18: qty 2

## 2016-12-18 MED ORDER — MIDAZOLAM HCL 2 MG/2ML IJ SOLN
INTRAMUSCULAR | Status: AC
  Filled 2016-12-18: qty 2

## 2016-12-18 MED ORDER — PROPOFOL 10 MG/ML IV BOLUS
INTRAVENOUS | Status: DC | PRN
  Administered 2016-12-18: 200 mg via INTRAVENOUS

## 2016-12-18 MED ORDER — SODIUM CHLORIDE 0.9 % IR SOLN
Status: DC | PRN
  Administered 2016-12-18: 3000 mL

## 2016-12-18 MED ORDER — MIDAZOLAM HCL 2 MG/2ML IJ SOLN: 0.5000 mg | Freq: Once | INTRAMUSCULAR | Status: DC | PRN

## 2016-12-18 MED ORDER — HYDROMORPHONE HCL 1 MG/ML IJ SOLN
0.2500 mg | INTRAMUSCULAR | Status: DC | PRN
  Administered 2016-12-18 (×4): 0.5 mg via INTRAVENOUS

## 2016-12-18 MED ORDER — FENTANYL CITRATE (PF) 100 MCG/2ML IJ SOLN
INTRAMUSCULAR | Status: AC
  Filled 2016-12-18: qty 2

## 2016-12-18 MED ORDER — MIDAZOLAM HCL 5 MG/5ML IJ SOLN
INTRAMUSCULAR | Status: DC | PRN
  Administered 2016-12-18: 2 mg via INTRAVENOUS

## 2016-12-18 MED ORDER — MEPERIDINE HCL 50 MG/ML IJ SOLN: 6.2500 mg | INTRAMUSCULAR | Status: DC | PRN

## 2016-12-18 MED ORDER — PROPOFOL 10 MG/ML IV BOLUS
INTRAVENOUS | Status: AC
  Filled 2016-12-18: qty 20

## 2016-12-18 MED ORDER — LIDOCAINE 2% (20 MG/ML) 5 ML SYRINGE
INTRAMUSCULAR | Status: AC
  Filled 2016-12-18: qty 5

## 2016-12-18 MED ORDER — FENTANYL CITRATE (PF) 100 MCG/2ML IJ SOLN
INTRAMUSCULAR | Status: DC | PRN
  Administered 2016-12-18 (×2): 50 ug via INTRAVENOUS
  Administered 2016-12-18: 100 ug via INTRAVENOUS

## 2016-12-18 MED ORDER — LIDOCAINE HCL (CARDIAC) 20 MG/ML IV SOLN
INTRAVENOUS | Status: DC | PRN
  Administered 2016-12-18: 100 mg via INTRATRACHEAL

## 2016-12-18 MED ORDER — DEXAMETHASONE SODIUM PHOSPHATE 10 MG/ML IJ SOLN
INTRAMUSCULAR | Status: DC | PRN
  Administered 2016-12-18: 10 mg via INTRAVENOUS

## 2016-12-18 MED ORDER — HYDROMORPHONE HCL 1 MG/ML IJ SOLN
1.0000 mg | Freq: Three times a day (TID) | INTRAMUSCULAR | Status: DC | PRN
  Administered 2016-12-18 – 2016-12-20 (×4): 1 mg via INTRAVENOUS
  Filled 2016-12-18 (×4): qty 1

## 2016-12-18 MED ORDER — DEXAMETHASONE SODIUM PHOSPHATE 10 MG/ML IJ SOLN
INTRAMUSCULAR | Status: AC
  Filled 2016-12-18: qty 1

## 2016-12-18 MED ORDER — LACTATED RINGERS IV SOLN
INTRAVENOUS | Status: DC | PRN
  Administered 2016-12-18: 16:00:00 via INTRAVENOUS

## 2016-12-18 MED ORDER — PROMETHAZINE HCL 25 MG/ML IJ SOLN: 6.2500 mg | INTRAMUSCULAR | Status: DC | PRN

## 2016-12-18 MED ORDER — FENTANYL CITRATE (PF) 100 MCG/2ML IJ SOLN: 25.0000 ug | INTRAMUSCULAR | Status: DC | PRN

## 2016-12-18 SURGICAL SUPPLY — 30 items
BLADE 10 SAFETY STRL DISP (BLADE) ×3 IMPLANT
BLADE HEX COATED 2.75 (ELECTRODE) IMPLANT
BLADE SURG 15 STRL LF DISP TIS (BLADE) ×1 IMPLANT
BLADE SURG 15 STRL SS (BLADE) ×2
BNDG GAUZE ELAST 4 BULKY (GAUZE/BANDAGES/DRESSINGS) ×3 IMPLANT
BRIEF STRETCH FOR OB PAD LRG (UNDERPADS AND DIAPERS) ×3 IMPLANT
COVER SURGICAL LIGHT HANDLE (MISCELLANEOUS) ×3 IMPLANT
DRAPE LAPAROTOMY T 98X78 PEDS (DRAPES) ×3 IMPLANT
ELECT PENCIL ROCKER SW 15FT (MISCELLANEOUS) ×3 IMPLANT
ELECT REM PT RETURN 15FT ADLT (MISCELLANEOUS) ×3 IMPLANT
GLOVE BIOGEL M STRL SZ7.5 (GLOVE) ×3 IMPLANT
GOWN STRL REUS W/TWL LRG LVL3 (GOWN DISPOSABLE) ×3 IMPLANT
HANDPIECE INTERPULSE COAX TIP (DISPOSABLE) ×2
KIT BASIN OR (CUSTOM PROCEDURE TRAY) ×6 IMPLANT
LEGGING LITHOTOMY PAIR STRL (DRAPES) ×3 IMPLANT
NEEDLE HYPO 22GX1.5 SAFETY (NEEDLE) IMPLANT
NS IRRIG 1000ML POUR BTL (IV SOLUTION) ×3 IMPLANT
PACK BASIC VI WITH GOWN DISP (CUSTOM PROCEDURE TRAY) ×3 IMPLANT
PACK GENERAL/GYN (CUSTOM PROCEDURE TRAY) IMPLANT
PAD ABD 8X10 STRL (GAUZE/BANDAGES/DRESSINGS) ×3 IMPLANT
SET HNDPC FAN SPRY TIP SCT (DISPOSABLE) ×1 IMPLANT
SPONGE LAP 18X18 X RAY DECT (DISPOSABLE) ×3 IMPLANT
SUPPORT SCROTAL LG STRP (MISCELLANEOUS) IMPLANT
SUPPORTER ATHLETIC LG (MISCELLANEOUS)
SUT CHROMIC 3 0 SH 27 (SUTURE) IMPLANT
SUT VIC AB 2-0 UR5 27 (SUTURE) IMPLANT
SUT VICRYL 0 TIES 12 18 (SUTURE) IMPLANT
SYR CONTROL 10ML LL (SYRINGE) IMPLANT
TOWEL OR 17X26 10 PK STRL BLUE (TOWEL DISPOSABLE) ×3 IMPLANT
WATER STERILE IRR 1500ML POUR (IV SOLUTION) IMPLANT

## 2016-12-18 NOTE — Care Management Note (Signed)
Case Management Note  Patient Details  Name: Stephen BostonJohn Knight MRN: [id number] Date of Birth: 1966/04/17  Subjective/Objective:50 y/o m admitted w/Scrotal abscess. From home. Patient works.No health Insurance. Referral for HHC. Spoke to patient-informed about Affordable Care Act-health insurance website,community resources-indigent pcp-[facility]-patient will call for own [facility] app[facility], [facility], [facility], [facility] [facility]. Patient will have his sister available to assist w/dsg changes-patient states the nurse will teach him & his sister-patient would have to pay out of pocket [facility]-patient declines H[facility]. Provided patient w/all resources-Patient voiced understanding.No further CM needs.                 Action/Plan:d/c home.   Expected Discharge Date:                  Expected Discharge Plan:  Home/Self Care  In-House Referral:     Discharge planning Services  CM Consult, Indigent Health Clinic  Post Acute Care Choice:    Choice offered to:     DME Arranged:    DME Agency:     HH Arranged:    HH Agency:     Status of Service:  In process, will continue to follow  If discussed at Long Length of Stay Meetings, dates discussed:    Additional Comments:  Lanier ClamMahabir, Cassidy Tashiro, RN 12/18/2016, 12:54 PM

## 2016-12-18 NOTE — Op Note (Signed)
Preoperative diagnosis: Scrotal abscess  Postoperative diagnosis: Scrotal abscess  Procedure: Irrigation and debridement of scrotal abscess  Surgeon: Moody BruinsLester S Chrisanne Loose, Jr. M.D.  Anesthesia: General  Complications: None  EBL: Minimal  Intraoperative findings: There were multiple pockets of persistent abscess noted that were drained.  Indication: Mr. Stephen Knight is a 51 year old gentleman who presented last week with an acute scrotal abscess.  He underwent initial incision and drainage last week.  He clinically improved but has had persistent intermittent fever and leukocytosis.  It was therefore recommended that he proceed to the operating room for further evaluation with irrigation and debridement today.  The potential risks, complications, and the expected recovery process were discussed with the patient in detail and informed consent was obtained.  Description of procedure: The patient was taken to the operating room and a general anesthetic was administered.  He was given preoperative antibiotics, placed in the dorsolithotomy position, and prepped and draped in the usual sterile fashion.  Next, a preoperative timeout was performed.    I examined his scrotum and opened up his incision with my finger.  This revealed multiple small areas of purulent drainage.  There was a larger area of purulent drainage inferiorly and I extended the scrotal incision inferiorly to allow better drainage of this area.  In addition, I was also able to find another area of purulent drainage just below the penile shaft in the mid scrotum.  After these areas were completely drained, I excised multiple areas of fibrinous tissue that had developed along the scrotal skin.  I then used 3 L of saline to pulse lavage the wound to help to establish some granulation tissue.  I then packed the wound with a wet-to-dry Kerlix dressing.  The patient tolerated the procedure well without complications.  He was able to be awakened and  transferred to recovery unit in satisfactory condition.

## 2016-12-18 NOTE — Progress Notes (Signed)
Patient ID: Stephen BostonJohn Knight, male   DOB: [birth year], 51 y.o.   MRN: [id number]  5 Days Post-Op Subjective: Pt had fever Saturday night and WBC increased.  Dr. Mena GoesEskridge opened sutures to better drain scrotal wound and packed with Wet to Dry dressing.  Wound culture positive for MRSA.  Pt now on Bactrim.  He had fever to 101.2 last night.  Objective: Vital signs in last 24 hours: Temp:  [98.3 F (36.8 C)-101.2 F (38.4 C)] 98.3 F (36.8 C) (06/11 0500) Pulse Rate:  [76-83] 76 (06/11 0500) Resp:  [16-18] 16 (06/11 0500) BP: (107-123)/(54-76) 118/76 (06/11 0500) SpO2:  [97 %-100 %] 100 % (06/11 0500)  Intake/Output from previous day: 06/10 0701 - 06/11 0700 In: 840 [P.O.:840] Out: 1325 [Urine:1325] Intake/Output this shift: No intake/output data recorded.  Physical Exam:  General: Alert and oriented GU: Scrotal wound opened and packed.  Beginning of granulation tissue and no obvious remaining pockets of purulence.  Scrotum edematous but improved compared to 48 hours ago.  No other areas of clear fluctuance noted.  Lab Results:  Recent Labs  12/16/16 0635 12/17/16 0548 12/18/16 0550  HGB 11.7* 11.9* 12.1*  HCT 33.4* 34.1* 34.3*   CBC Latest Ref Rng & Units 12/18/2016 12/17/2016 12/16/2016  WBC 4.0 - 10.5 K/uL 14.3(H) 14.4(H) 13.3(H)  Hemoglobin 13.0 - 17.0 g/dL 12.1(L) 11.9(L) 11.7(L)  Hematocrit 39.0 - 52.0 % 34.3(L) 34.1(L) 33.4(L)  Platelets 150 - 400 K/uL 370 280 221     BMET  Recent Labs  12/17/16 0548  NA 142  K 3.7  CL 105  CO2 28  GLUCOSE 120*  BUN 12  CREATININE 1.01  CALCIUM 8.8*     Studies/Results: No results found.  Assessment/Plan: Scrotal abscess - Continue Bactrim and dressing changes BID.  [facility] to [facility] wound care at home. - Considering persistent fever and WBC, will plan to take to OR later today to wash out wound and ensure no other areas that require drainage.  NPO.  I discussed the potential benefits and risks of the  procedure, side effects of the proposed treatment, the likelihood of the patient achieving the goals of the procedure, and any potential problems that might occur during the procedure or recuperation.  He gives informed consent to proceed. - Likely home tomorrow once [facility] arranged and if improving clinically over the next 24 hours.   LOS: 5 days   Faatimah Spielberg,LES 12/18/2016, 7:12 AM

## 2016-12-18 NOTE — Transfer of Care (Signed)
Immediate Anesthesia Transfer of Care Note  Patient: Stephen BostonJohn Knight  Procedure(s) Performed: Procedure(s): Irrigation and Debridiment of Scrotum (N/A)  Patient Location: PACU  Anesthesia Type:General  Level of Consciousness: awake, alert  and oriented  Airway & Oxygen Therapy: Patient Spontanous Breathing and Patient connected to face mask oxygen  Post-op Assessment: Report given to RN and Post -op Vital signs reviewed and stable  Post vital signs: Reviewed and stable  Last Vitals:  Vitals:   12/18/16 0500 12/18/16 1330  BP: 118/76   Pulse: 76 88  Resp: 16   Temp: 36.8 C 37.9 C    Last Pain:  Vitals:   12/18/16 1330  TempSrc: Oral  PainSc:       Patients Stated Pain Goal: 3 (12/18/16 0815)  Complications: No apparent anesthesia complications

## 2016-12-18 NOTE — Anesthesia Procedure Notes (Signed)
Procedure Name: LMA Insertion Date/Time: 12/18/2016 4:14 PM Performed by: [place], Ellesse Antenucci C Pre-anesthesia Checklist: Patient identified and Suction available Patient Re-evaluated:Patient Re-evaluated prior to inductionOxygen Delivery Method: Circle system utilized Preoxygenation: Pre-oxygenation with 100% oxygen Intubation Type: IV induction Ventilation: Mask ventilation without difficulty LMA: LMA inserted LMA Size: 5.0 Number of attempts: 1 Airway Equipment and Method: Bite block Placement Confirmation: positive ETCO2 Tube secured with: Tape Dental Injury: Teeth and Oropharynx as per pre-operative assessment

## 2016-12-18 NOTE — Anesthesia Postprocedure Evaluation (Signed)
Anesthesia Post Note  Patient: Stephen Knight  Procedure(s) Performed: Procedure(s) (LRB): Irrigation and Debridiment of Scrotum (N/A)     Patient location during evaluation: PACU Anesthesia Type: General Level of consciousness: awake and alert, patient cooperative and oriented Pain management: pain level controlled Vital Signs Assessment: post-procedure vital signs reviewed and stable Respiratory status: spontaneous breathing, nonlabored ventilation and respiratory function stable Cardiovascular status: blood pressure returned to baseline and stable Postop Assessment: no signs of nausea or vomiting Anesthetic complications: no    Last Vitals:  Vitals:   12/18/16 1745 12/18/16 1810  BP: (!) 139/92 134/77  Pulse: 88 84  Resp: 12 14  Temp: 37 C 37.4 C    Last Pain:  Vitals:   12/18/16 1745  TempSrc:   PainSc: 5                  Prosperity Darrough,E. Dezirae Service

## 2016-12-18 NOTE — Anesthesia Preprocedure Evaluation (Addendum)
Anesthesia Evaluation  Patient identified by MRN, date of birth, ID band Patient awake    Reviewed: Allergy & Precautions, NPO status , Patient's Chart, lab work & pertinent test results  History of Anesthesia Complications Negative for: history of anesthetic complications  Airway Mallampati: II  TM Distance: >3 FB Neck ROM: Full    Dental  (+) Chipped, Missing, Dental Advisory Given   Pulmonary neg pulmonary ROS,    breath sounds clear to auscultation       Cardiovascular negative cardio ROS   Rhythm:Regular Rate:Normal     Neuro/Psych negative neurological ROS     GI/Hepatic negative GI ROS, Neg liver ROS,   Endo/Other  Morbid obesity  Renal/GU negative Renal ROS     Musculoskeletal   Abdominal (+) + obese,   Peds  Hematology negative hematology ROS (+)   Anesthesia Other Findings   Reproductive/Obstetrics                             Anesthesia Physical Anesthesia Plan  ASA: II  Anesthesia Plan: General   Post-op Pain Management:    Induction: Intravenous  PONV Risk Score and Plan: 2 and Ondansetron and Dexamethasone  Airway Management Planned: LMA  Additional Equipment:   Intra-op Plan:   Post-operative Plan:   Informed Consent: I have reviewed the patients History and Physical, chart, labs and discussed the procedure including the risks, benefits and alternatives for the proposed anesthesia with the patient or authorized representative who has indicated his/her understanding and acceptance.   Dental advisory given  Plan Discussed with: CRNA and Surgeon  Anesthesia Plan Comments: (Plan routine monitors, GA- LMA OK)        Anesthesia Quick Evaluation

## 2016-12-19 ENCOUNTER — Encounter (HOSPITAL_COMMUNITY): Payer: Self-pay | Admitting: Urology

## 2016-12-19 LAB — CBC
HEMATOCRIT: 34.3 % — AB (ref 39.0–52.0)
HEMOGLOBIN: 11.9 g/dL — AB (ref 13.0–17.0)
MCH: 31.7 pg (ref 26.0–34.0)
MCHC: 34.7 g/dL (ref 30.0–36.0)
MCV: 91.5 fL (ref 78.0–100.0)
Platelets: 448 10*3/uL — ABNORMAL HIGH (ref 150–400)
RBC: 3.75 MIL/uL — ABNORMAL LOW (ref 4.22–5.81)
RDW: 14.1 % (ref 11.5–15.5)
WBC: 18 10*3/uL — ABNORMAL HIGH (ref 4.0–10.5)

## 2016-12-19 NOTE — Progress Notes (Addendum)
Patient reporting to bedside RN, MD and case manager that he will have his family learn how to do dressing changes and that they will help him at home. He declined HHRN again today as he would have to pay out of pocket for their services, per Olegario MessierKathy, Sports coachCase Manager. Will teach family about dressing change if they are available during my shift, if not, will have next shift RN to teach the family.   Arta BruceDeutsch, Jazmaine Fuelling [facility] 12/19/2016 1:32 PM   RN discussed with the patient that if he could have family members present for dressing change tonight or early in the AM, then the RN could show them how to do the dressing change. Patient states that he will be able to show/tell his family members how to change the dressing. RN discussed with the patient about the areas that were deeper and needed to have more packing in place. He verbalized understanding. Will continue to discuss dressing changes with patient as they are being done by the RN or MD.  Arta BruceDeutsch, Khali Perella [facility] 12/19/2016 4:28 PM

## 2016-12-19 NOTE — Progress Notes (Signed)
Patient ID: Stephen Knight, male   DOB: [birth year], 51 y.o.   MRN: [id number]  1 Day Post-Op Subjective: Pt s/p irrigation and debridement last night with further purulent pockets opened and devitalized skin removed.  Pt feeling better this morning subjectively.  No fever overnight.  Objective: Vital signs in last 24 hours: Temp:  [98.5 F (36.9 C)-100.2 F (37.9 C)] 98.5 F (36.9 C) (06/12 0637) Pulse Rate:  [74-98] 74 (06/12 0637) Resp:  [12-20] 20 (06/12 0637) BP: (118-153)/(66-92) 118/66 (06/12 0637) SpO2:  [95 %-100 %] 96 % (06/12 0637)  Intake/Output from previous day: 06/11 0701 - 06/12 0700 In: 800 [I.V.:800] Out: 2265 [Urine:2250; Blood:15] Intake/Output this shift: No intake/output data recorded.  Physical Exam:  General: Alert and oriented GU: Dressing changed this morning.  No purulent drainage noted.  Granulation tissue beginning.  New wet to dry dressing applied with packing deep into space around the right testis.  Lab Results:  Recent Labs  12/17/16 0548 12/18/16 0550 12/19/16 0645  HGB 11.9* 12.1* 11.9*  HCT 34.1* 34.3* 34.3*   CBC Latest Ref Rng & Units 12/19/2016 12/18/2016 12/17/2016  WBC 4.0 - 10.5 K/uL 18.0(H) 14.3(H) 14.4(H)  Hemoglobin 13.0 - 17.0 g/dL 11.9(L) 12.1(L) 11.9(L)  Hematocrit 39.0 - 52.0 % 34.3(L) 34.3(L) 34.1(L)  Platelets 150 - 400 K/uL 448(H) 370 280     BMET  Recent Labs  12/17/16 0548  NA 142  K 3.7  CL 105  CO2 28  GLUCOSE 120*  BUN 12  CREATININE 1.01  CALCIUM 8.8*     Studies/Results: No results found.  Assessment/Plan: Scrotal abscess - Continue Bactrim DS bid for MRSA culture - Wet to dry dressing changes that will now require more aggressive wound care and dressing changes at least BID.  Will need to re-evaluate need for home healthcare to assist with dressings.  Patient is separated from his wife but she is a CNA and willing to help with dressing changes apparently and this may be an option for him.  I asked  the patient and nursing to evaluate this option today and determine whether home health will be needed with anticipation of discharge tomorrow. - In light of findings last night, need for IV pain meds for dressing changes, and increased WBC, will continue inpatient care for next 24 hours with plans for probable D/C home tomorrow if the above issues are resolved.   LOS: 6 days   Kyriaki Moder,LES 12/19/2016, 8:05 AM

## 2016-12-20 LAB — CBC
HEMATOCRIT: 40.6 % (ref 39.0–52.0)
Hemoglobin: 13.9 g/dL (ref 13.0–17.0)
MCH: 31.6 pg (ref 26.0–34.0)
MCHC: 34.2 g/dL (ref 30.0–36.0)
MCV: 92.3 fL (ref 78.0–100.0)
Platelets: 400 10*3/uL (ref 150–400)
RBC: 4.4 MIL/uL (ref 4.22–5.81)
RDW: 14.2 % (ref 11.5–15.5)
WBC: 9.5 10*3/uL (ref 4.0–10.5)

## 2016-12-20 MED ORDER — SULFAMETHOXAZOLE-TRIMETHOPRIM 800-160 MG PO TABS: 1.0000 | ORAL_TABLET | Freq: Two times a day (BID) | ORAL | 0 refills | Status: DC

## 2016-12-20 NOTE — Care Management Note (Signed)
Case Management Note  Patient Details  Name: Stephen BostonJohn Vandiver MRN: [id number] Date of Birth: 1966-01-20  Subjective/Objective: No CM needs.                   Action/Plan:d/c home.   Expected Discharge Date:  12/20/16               Expected Discharge Plan:  Home/Self Care  In-House Referral:     Discharge planning Services  CM Consult, Indigent Health Clinic  Post Acute Care Choice:    Choice offered to:     DME Arranged:    DME Agency:     HH Arranged:    HH Agency:     Status of Service:  Completed, signed off  If discussed at [employer] Length of [employer] Meetings, dates discussed:    Additional Comments:  Lanier ClamMahabir, Etrulia Zarr, RN 12/20/2016, 10:47 AM

## 2016-12-20 NOTE — Progress Notes (Signed)
Patient ID: Stephen BostonJohn Knight, male   DOB: [birth year], 51 y.o.   MRN: [id number]  2 Days Post-Op Subjective: Pt doing well.  Tolerating dressing changes.  He has elected to have family perform dressing changes BID at home.  Objective: Vital signs in last 24 hours: Temp:  [98.1 F (36.7 C)-98.7 F (37.1 C)] 98.7 F (37.1 C) (06/13 0506) Pulse Rate:  [73-76] 76 (06/13 0506) Resp:  [17-18] 18 (06/13 0506) BP: (117-128)/(68-79) 118/74 (06/13 0506) SpO2:  [97 %-99 %] 99 % (06/13 0506)  Intake/Output from previous day: 06/12 0701 - 06/13 0700 In: 360 [P.O.:360] Out: 2250 [Urine:2250] Intake/Output this shift: No intake/output data recorded.  Physical Exam:  General: Alert and oriented GU: Wound beginning to granulate.  No purulent drainage or other fluctuant areas.  Lab Results:  CBC not drawn this morning (my order was inadvertently cancelled).    Studies/Results: No results found.  Assessment/Plan: Scrotal abscess - Continue Bactrim DS BID - Continue wet to dry dressing changes BID - Will d/c home today if WBC decreasing as expected - F/U later this week for wound check   LOS: 7 days   Stephen Knight,LES 12/20/2016, 7:55 AM

## 2016-12-20 NOTE — Progress Notes (Signed)
Patient A&Ox4, ambulatory. No change from am assessment. Discharge instructions reviewed, questions, concerns denied. Additional instructions provided regarding wound care.

## 2016-12-20 NOTE — Progress Notes (Signed)
Postop appt scheduled for Tuesday 6/19 at 0915.

## 2016-12-20 NOTE — Progress Notes (Signed)
Patient has all belongings.verified pt has wallet Supplies provided for dressing changes, additional instruction given.

## 2016-12-22 LAB — AEROBIC/ANAEROBIC CULTURE (SURGICAL/DEEP WOUND)

## 2016-12-22 LAB — AEROBIC/ANAEROBIC CULTURE W GRAM STAIN (SURGICAL/DEEP WOUND)

## 2018-12-25 ENCOUNTER — Other Ambulatory Visit: Payer: Self-pay

## 2018-12-25 ENCOUNTER — Inpatient Hospital Stay (HOSPITAL_COMMUNITY)
Admission: EM | Admit: 2018-12-25 | Discharge: 2018-12-27 | DRG: 638 | Disposition: A | Discharge status: Home / Self Care | Payer: Self-pay | Attending: Internal Medicine | Admitting: Internal Medicine

## 2018-12-25 ENCOUNTER — Emergency Department (HOSPITAL_COMMUNITY): Payer: Self-pay

## 2018-12-25 ENCOUNTER — Encounter (HOSPITAL_COMMUNITY): Payer: Self-pay

## 2018-12-25 DIAGNOSIS — E876 Hypokalemia: Secondary | ICD-10-CM | POA: Diagnosis present

## 2018-12-25 DIAGNOSIS — K5900 Constipation, unspecified: Secondary | ICD-10-CM | POA: Diagnosis present

## 2018-12-25 DIAGNOSIS — E1165 Type 2 diabetes mellitus with hyperglycemia: Secondary | ICD-10-CM

## 2018-12-25 DIAGNOSIS — E669 Obesity, unspecified: Secondary | ICD-10-CM | POA: Diagnosis present

## 2018-12-25 DIAGNOSIS — D649 Anemia, unspecified: Secondary | ICD-10-CM | POA: Diagnosis present

## 2018-12-25 DIAGNOSIS — E871 Hypo-osmolality and hyponatremia: Secondary | ICD-10-CM | POA: Diagnosis present

## 2018-12-25 DIAGNOSIS — R17 Unspecified jaundice: Secondary | ICD-10-CM | POA: Diagnosis present

## 2018-12-25 DIAGNOSIS — Z7189 Other specified counseling: Secondary | ICD-10-CM

## 2018-12-25 DIAGNOSIS — Z79899 Other long term (current) drug therapy: Secondary | ICD-10-CM

## 2018-12-25 DIAGNOSIS — E785 Hyperlipidemia, unspecified: Secondary | ICD-10-CM | POA: Diagnosis present

## 2018-12-25 DIAGNOSIS — E1110 Type 2 diabetes mellitus with ketoacidosis without coma: Principal | ICD-10-CM

## 2018-12-25 DIAGNOSIS — Z6834 Body mass index (BMI) 34.0-34.9, adult: Secondary | ICD-10-CM

## 2018-12-25 DIAGNOSIS — D72819 Decreased white blood cell count, unspecified: Secondary | ICD-10-CM | POA: Diagnosis present

## 2018-12-25 DIAGNOSIS — Z1159 Encounter for screening for other viral diseases: Secondary | ICD-10-CM

## 2018-12-25 DIAGNOSIS — E119 Type 2 diabetes mellitus without complications: Secondary | ICD-10-CM

## 2018-12-25 LAB — BASIC METABOLIC PANEL
Anion gap: 20 — ABNORMAL HIGH (ref 5–15)
Anion gap: 18 — ABNORMAL HIGH (ref 5–15)
Anion gap: 16 — ABNORMAL HIGH (ref 5–15)
Anion gap: 11 (ref 5–15)
BUN: 17 mg/dL (ref 6–20)
BUN: 15 mg/dL (ref 6–20)
BUN: 13 mg/dL (ref 6–20)
BUN: 11 mg/dL (ref 6–20)
CO2: 19 mmol/L — ABNORMAL LOW (ref 22–32)
CO2: 18 mmol/L — ABNORMAL LOW (ref 22–32)
CO2: 20 mmol/L — ABNORMAL LOW (ref 22–32)
CO2: 21 mmol/L — ABNORMAL LOW (ref 22–32)
Calcium: 9.7 mg/dL (ref 8.9–10.3)
Calcium: 9.2 mg/dL (ref 8.9–10.3)
Calcium: 9.2 mg/dL (ref 8.9–10.3)
Calcium: 8.9 mg/dL (ref 8.9–10.3)
Chloride: 99 mmol/L (ref 98–111)
Chloride: 103 mmol/L (ref 98–111)
Chloride: 104 mmol/L (ref 98–111)
Chloride: 107 mmol/L (ref 98–111)
Creatinine, Ser: 1.2 mg/dL (ref 0.61–1.24)
Creatinine, Ser: 1.11 mg/dL (ref 0.61–1.24)
Creatinine, Ser: 1 mg/dL (ref 0.61–1.24)
Creatinine, Ser: 0.94 mg/dL (ref 0.61–1.24)
GFR calc Af Amer: >60 mL/min (ref >60)
GFR calc Af Amer: >60 mL/min (ref >60)
GFR calc Af Amer: >60 mL/min (ref >60)
GFR calc Af Amer: >60 mL/min (ref >60)
GFR calc non Af Amer: >60 mL/min (ref >60)
GFR calc non Af Amer: >60 mL/min (ref >60)
GFR calc non Af Amer: >60 mL/min (ref >60)
GFR calc non Af Amer: >60 mL/min (ref >60)
Glucose, Bld: 485 mg/dL — ABNORMAL HIGH (ref 70–99)
Glucose, Bld: 430 mg/dL — ABNORMAL HIGH (ref 70–99)
Glucose, Bld: 296 mg/dL — ABNORMAL HIGH (ref 70–99)
Glucose, Bld: 151 mg/dL — ABNORMAL HIGH (ref 70–99)
Potassium: 4.5 mmol/L (ref 3.5–5.1)
Potassium: 4.8 mmol/L (ref 3.5–5.1)
Potassium: 4.1 mmol/L (ref 3.5–5.1)
Potassium: 3.8 mmol/L (ref 3.5–5.1)
Sodium: 138 mmol/L (ref 135–145)
Sodium: 139 mmol/L (ref 135–145)
Sodium: 140 mmol/L (ref 135–145)
Sodium: 139 mmol/L (ref 135–145)

## 2018-12-25 LAB — CBC WITH DIFFERENTIAL/PLATELET
Abs Immature Granulocytes: 0.01 10*3/uL (ref 0.00–0.07)
Basophils Absolute: 0 10*3/uL (ref 0.0–0.1)
Basophils Relative: 0 %
Eosinophils Absolute: 0 10*3/uL (ref 0.0–0.5)
Eosinophils Relative: 1 %
HCT: 50 % (ref 39.0–52.0)
Hemoglobin: 16.6 g/dL (ref 13.0–17.0)
Immature Granulocytes: 0 %
Lymphocytes Relative: 51 %
Lymphs Abs: 3 10*3/uL (ref 0.7–4.0)
MCH: 30 pg (ref 26.0–34.0)
MCHC: 33.2 g/dL (ref 30.0–36.0)
MCV: 90.3 fL (ref 80.0–100.0)
Monocytes Absolute: 0.5 10*3/uL (ref 0.1–1.0)
Monocytes Relative: 9 %
Neutro Abs: 2.3 10*3/uL (ref 1.7–7.7)
Neutrophils Relative %: 39 %
Platelets: 248 10*3/uL (ref 150–400)
RBC: 5.54 MIL/uL (ref 4.22–5.81)
RDW: 13 % (ref 11.5–15.5)
WBC: 5.8 10*3/uL (ref 4.0–10.5)
nRBC: 0 % (ref 0.0–0.2)

## 2018-12-25 LAB — GLUCOSE, CAPILLARY
Glucose-Capillary: 381 mg/dL — ABNORMAL HIGH (ref 70–99)
Glucose-Capillary: 320 mg/dL — ABNORMAL HIGH (ref 70–99)
Glucose-Capillary: 313 mg/dL — ABNORMAL HIGH (ref 70–99)
Glucose-Capillary: 226 mg/dL — ABNORMAL HIGH (ref 70–99)
Glucose-Capillary: 212 mg/dL — ABNORMAL HIGH (ref 70–99)
Glucose-Capillary: 176 mg/dL — ABNORMAL HIGH (ref 70–99)
Glucose-Capillary: 144 mg/dL — ABNORMAL HIGH (ref 70–99)
Glucose-Capillary: 108 mg/dL — ABNORMAL HIGH (ref 70–99)
Glucose-Capillary: 193 mg/dL — ABNORMAL HIGH (ref 70–99)
Glucose-Capillary: 177 mg/dL — ABNORMAL HIGH (ref 70–99)

## 2018-12-25 LAB — TROPONIN I: Troponin I: <0.03 ng/mL (ref <0.03)

## 2018-12-25 LAB — CBG MONITORING, ED
Glucose-Capillary: 454 mg/dL — ABNORMAL HIGH (ref 70–99)
Glucose-Capillary: 383 mg/dL — ABNORMAL HIGH (ref 70–99)

## 2018-12-25 LAB — MRSA PCR SCREENING: MRSA by PCR: NEGATIVE

## 2018-12-25 LAB — HEMOGLOBIN A1C
Hgb A1c MFr Bld: 14.9 % — ABNORMAL HIGH (ref 4.8–5.6)
Mean Plasma Glucose: 380.93 mg/dL

## 2018-12-25 MED ORDER — SODIUM CHLORIDE 0.9 % IV SOLN
INTRAVENOUS | Status: DC
  Administered 2018-12-25: 12:00:00 via INTRAVENOUS

## 2018-12-25 MED ORDER — ENOXAPARIN SODIUM 40 MG/0.4ML ~~LOC~~ SOLN
40.0000 mg | SUBCUTANEOUS | Status: DC
  Administered 2018-12-25 – 2018-12-26 (×2): 40 mg via SUBCUTANEOUS
  Filled 2018-12-25 (×2): qty 0.4

## 2018-12-25 MED ORDER — CHLORHEXIDINE GLUCONATE CLOTH 2 % EX PADS
6.0000 | MEDICATED_PAD | Freq: Every day | CUTANEOUS | Status: DC
  Administered 2018-12-25: 6 via TOPICAL

## 2018-12-25 MED ORDER — IOHEXOL 350 MG/ML SOLN
100.0000 mL | Freq: Once | INTRAVENOUS | Status: AC | PRN
  Administered 2018-12-25: 100 mL via INTRAVENOUS

## 2018-12-25 MED ORDER — INSULIN REGULAR BOLUS VIA INFUSION
0.0000 [IU] | Freq: Three times a day (TID) | INTRAVENOUS | Status: DC
  Filled 2018-12-25: qty 10

## 2018-12-25 MED ORDER — SODIUM CHLORIDE (PF) 0.9 % IJ SOLN
INTRAMUSCULAR | Status: AC
  Filled 2018-12-25: qty 50

## 2018-12-25 MED ORDER — DEXTROSE-NACL 5-0.45 % IV SOLN
INTRAVENOUS | Status: DC
  Administered 2018-12-25: 15:00:00 via INTRAVENOUS

## 2018-12-25 MED ORDER — LIVING WELL WITH DIABETES BOOK
Freq: Once | Status: AC
  Administered 2018-12-26: 17:00:00
  Filled 2018-12-25 (×2): qty 1

## 2018-12-25 MED ORDER — INSULIN REGULAR(HUMAN) IN NACL 100-0.9 UT/100ML-% IV SOLN
INTRAVENOUS | Status: DC
  Administered 2018-12-25: 3.2 [IU]/h via INTRAVENOUS
  Filled 2018-12-25: qty 100

## 2018-12-25 MED ORDER — INSULIN ASPART 100 UNIT/ML ~~LOC~~ SOLN: 0.0000 [IU] | Freq: Every day | SUBCUTANEOUS | Status: DC

## 2018-12-25 MED ORDER — INSULIN GLARGINE 100 UNIT/ML ~~LOC~~ SOLN
10.0000 [IU] | Freq: Once | SUBCUTANEOUS | Status: AC
  Administered 2018-12-25: 10 [IU] via SUBCUTANEOUS
  Filled 2018-12-25: qty 0.1

## 2018-12-25 MED ORDER — KETOROLAC TROMETHAMINE 30 MG/ML IJ SOLN
15.0000 mg | Freq: Once | INTRAMUSCULAR | Status: DC
  Filled 2018-12-25: qty 1

## 2018-12-25 MED ORDER — DEXTROSE 50 % IV SOLN: 25.0000 mL | INTRAVENOUS | Status: DC | PRN

## 2018-12-25 MED ORDER — INSULIN ASPART 100 UNIT/ML ~~LOC~~ SOLN
0.0000 [IU] | Freq: Three times a day (TID) | SUBCUTANEOUS | Status: DC
  Administered 2018-12-26 (×2): 8 [IU] via SUBCUTANEOUS
  Administered 2018-12-26: 15 [IU] via SUBCUTANEOUS
  Administered 2018-12-27 (×2): 8 [IU] via SUBCUTANEOUS

## 2018-12-25 MED ORDER — SODIUM CHLORIDE 0.9 % IV BOLUS
1000.0000 mL | Freq: Once | INTRAVENOUS | Status: AC
  Administered 2018-12-25: 10:00:00 1000 mL via INTRAVENOUS

## 2018-12-25 MED ORDER — DOCUSATE SODIUM 100 MG PO CAPS
200.0000 mg | ORAL_CAPSULE | Freq: Every day | ORAL | Status: DC
  Administered 2018-12-26 (×2): 200 mg via ORAL
  Filled 2018-12-25 (×2): qty 2

## 2018-12-25 MED ORDER — POLYETHYLENE GLYCOL 3350 17 G PO PACK
17.0000 g | PACK | Freq: Every day | ORAL | Status: DC | PRN
  Filled 2018-12-25: qty 1

## 2018-12-25 NOTE — ED Notes (Signed)
Writer of this note attempted twice to gain IV access. Was able to obtain blood, IV was unsuccessful.

## 2018-12-25 NOTE — Discharge Instructions (Signed)
Person Under Monitoring Name: Stephen Knight  Location: [address] [place] [address]   Infection Prevention Recommendations for Individuals Confirmed to have, or Being Evaluated for, 2019 Novel Coronavirus (COVID-19) Infection Who Receive Care at Home  Individuals who are confirmed to have, or are being evaluated for, COVID-19 should follow the prevention steps below until a healthcare provider or local or state health department says they can return to normal activities.  Stay home except to get medical care You should restrict activities outside your home, except for getting medical care. Do not go to work, school, or public areas, and do not use public transportation or taxis.  Call ahead before visiting your doctor Before your medical appointment, call the healthcare provider and tell them that you have, or are being evaluated for, COVID-19 infection. This will help the healthcare providers office take steps to keep other people from getting infected. Ask your healthcare provider to call the local or state health department.  Monitor your symptoms Seek prompt medical attention if your illness is worsening (e.g., difficulty breathing). Before going to your medical appointment, call the healthcare provider and tell them that you have, or are being evaluated for, COVID-19 infection. Ask your healthcare provider to call the local or state health department.  Wear a facemask You should wear a facemask that covers your nose and mouth when you are in the same room with other people and when you visit a healthcare provider. People who live with or visit you should also wear a facemask while they are in the same room with you.  Separate yourself from other people in your home As much as possible, you should stay in a different room from other people in your home. Also, you should use a separate bathroom, if available.  Avoid sharing household items You should not share  dishes, drinking glasses, cups, eating utensils, towels, bedding, or other items with other people in your home. After using these items, you should wash them thoroughly with soap and water.  Cover your coughs and sneezes Cover your mouth and nose with a tissue when you cough or sneeze, or you can cough or sneeze into your sleeve. Throw used tissues in a lined trash can, and immediately wash your hands with soap and water for at least 20 seconds or use an alcohol-based hand rub.  Wash your [facility] your hands often and thoroughly with soap and water for at least 20 seconds. You can use an alcohol-based hand sanitizer if soap and water are not available and if your hands are not visibly dirty. Avoid touching your eyes, nose, and mouth with unwashed hands.   Prevention Steps for Caregivers and Household Members of Individuals Confirmed to have, or Being Evaluated for, COVID-19 Infection Being Cared for in the Home  If you live with, or provide care at home for, a person confirmed to have, or being evaluated for, COVID-19 infection please follow these guidelines to prevent infection:  Follow healthcare providers instructions Make sure that you understand and can help the patient follow any healthcare provider instructions for all care.  Provide for the patients basic needs You should help the patient with basic needs in the home and provide support for getting groceries, prescriptions, and other personal needs.  Monitor the patients symptoms If they are getting sicker, call his or her medical provider and tell them that the patient has, or is being evaluated for, COVID-19 infection. This will help the healthcare providers  office take steps to keep other people from getting infected. Ask the healthcare provider to call the local or state health department.  Limit the number of people who have contact with the patient If possible, have only one caregiver for the patient. Other  household members should stay in another home or place of residence. If this is not possible, they should stay in another room, or be separated from the patient as much as possible. Use a separate bathroom, if available. Restrict visitors who do not have an essential need to be in the home.  Keep older adults, very young children, and other sick people away from the patient Keep older adults, very young children, and those who have compromised immune systems or chronic health conditions away from the patient. This includes people with chronic heart, lung, or kidney conditions, diabetes, and cancer.  Ensure good ventilation Make sure that shared spaces in the home have good air flow, such as from an air conditioner or an opened window, weather permitting.  Wash your hands often Wash your hands often and thoroughly with soap and water for at least 20 seconds. You can use an alcohol based hand sanitizer if soap and water are not available and if your hands are not visibly dirty. Avoid touching your eyes, nose, and mouth with unwashed hands. Use disposable paper towels to dry your hands. If not available, use dedicated cloth towels and replace them when they become wet.  Wear a facemask and gloves Wear a disposable facemask at all times in the room and gloves when you touch or have contact with the patients blood, body fluids, and/or secretions or excretions, such as sweat, saliva, sputum, nasal mucus, vomit, urine, or feces.  Ensure the mask fits over your nose and mouth tightly, and do not touch it during use. Throw out disposable facemasks and gloves after using them. Do not reuse. Wash your hands immediately after removing your facemask and gloves. If your personal clothing becomes contaminated, carefully remove clothing and launder. Wash your hands after handling contaminated clothing. Place all used disposable facemasks, gloves, and other waste in a lined container before disposing them with  other household waste. Remove gloves and wash your hands immediately after handling these items.  Do not share dishes, glasses, or other household items with the patient Avoid sharing household items. You should not share dishes, drinking glasses, cups, eating utensils, towels, bedding, or other items with a patient who is confirmed to have, or being evaluated for, COVID-19 infection. After the person uses these items, you should wash them thoroughly with soap and water.  Wash laundry thoroughly Immediately remove and wash clothes or bedding that have blood, body fluids, and/or secretions or excretions, such as sweat, saliva, sputum, nasal mucus, vomit, urine, or feces, on them. Wear gloves when handling laundry from the patient. Read and follow directions on labels of laundry or clothing items and detergent. In general, wash and dry with the warmest temperatures recommended on the label.  Clean all areas the individual has used often Clean all touchable surfaces, such as counters, tabletops, doorknobs, bathroom fixtures, toilets, phones, keyboards, tablets, and bedside tables, every day. Also, clean any surfaces that may have blood, body fluids, and/or secretions or excretions on them. Wear gloves when cleaning surfaces the patient has come in contact with. Use a diluted bleach solution (e.g., dilute bleach with 1 part bleach and 10 parts water) or a household disinfectant with a label that says EPA-registered for coronaviruses. To make a  bleach solution at home, add 1 tablespoon of bleach to 1 quart (4 cups) of water. For a larger supply, add  cup of bleach to 1 gallon (16 cups) of water. Read labels of cleaning products and follow recommendations provided on product labels. Labels contain instructions for safe and effective use of the cleaning product including precautions you should take when applying the product, such as wearing gloves or eye protection and making sure you have good ventilation  during use of the product. Remove gloves and wash hands immediately after cleaning.  Monitor yourself for signs and symptoms of illness Caregivers and household members are considered close contacts, should monitor their health, and will be asked to limit movement outside of the home to the extent possible. Follow the monitoring steps for close contacts listed on the symptom monitoring form.   ? If you have additional questions, contact your local health department or call the epidemiologist on call at [phone] (available 24/7). ? This guidance is subject to change. For the most up-to-date guidance from [facility], please refer to their website: [email or web address]
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
## 2018-12-25 NOTE — ED Notes (Signed)
Patient transported to CT 

## 2018-12-25 NOTE — ED Provider Notes (Signed)
Pt received at sign out with labs and CT-A chest pending. Please see previous EDP note for full HPI/H&P/MDM. CT-A reassuring. Labs with new DKA. IVF NS bolus and IV insulin gtt started. 0945:  T/C returned from [facility]. Tawanna Solo, case discussed, including:  HPI, pertinent PM/SHx, VS/PE, dx testing, ED course and treatment:  Agreeable to admit.    BP 120/78   Pulse 89   Temp 98.5 F (36.9 C) (Oral)   Resp (!) 21   Ht 6' (1.829 m)   SpO2 98%   BMI 34.18 kg/m   MDM Reviewed: previous chart, nursing note and vitals Reviewed previous: labs and ECG Interpretation: labs, ECG and CT scan Total time providing critical care: 30-74 minutes. This excludes time spent performing separately reportable procedures and services. Consults: admitting MD    CRITICAL CARE Performed by: Francine Graven Total critical care time: 35 minutes Critical care time was exclusive of separately billable procedures and treating other patients. Critical care was necessary to treat or prevent imminent or life-threatening deterioration. Critical care was time spent personally by me on the following activities: development of treatment plan with patient and/or surrogate as well as nursing, discussions with consultants, evaluation of patient's response to treatment, examination of patient, obtaining history from patient or surrogate, ordering and performing treatments and interventions, ordering and review of laboratory studies, ordering and review of radiographic studies, pulse oximetry and re-evaluation of patient's condition.   Results for orders placed or performed during the hospital encounter of 12/25/18  CBC with Differential/Platelet  Result Value Ref Range   WBC 5.8 4.0 - 10.5 K/uL   RBC 5.54 4.22 - 5.81 MIL/uL   Hemoglobin 16.6 13.0 - 17.0 g/dL   HCT 50.0 39.0 - 52.0 %   MCV 90.3 80.0 - 100.0 fL   MCH 30.0 26.0 - 34.0 pg   MCHC 33.2 30.0 - 36.0 g/dL   RDW 13.0 11.5 - 15.5 %   Platelets 248 150 - 400  K/uL   nRBC 0.0 0.0 - 0.2 %   Neutrophils Relative % 39 %   Neutro Abs 2.3 1.7 - 7.7 K/uL   Lymphocytes Relative 51 %   Lymphs Abs 3.0 0.7 - 4.0 K/uL   Monocytes Relative 9 %   Monocytes Absolute 0.5 0.1 - 1.0 K/uL   Eosinophils Relative 1 %   Eosinophils Absolute 0.0 0.0 - 0.5 K/uL   Basophils Relative 0 %   Basophils Absolute 0.0 0.0 - 0.1 K/uL   Immature Granulocytes 0 %   Abs Immature Granulocytes 0.01 0.00 - 0.07 K/uL  Basic metabolic panel  Result Value Ref Range   Sodium 138 135 - 145 mmol/L   Potassium 4.5 3.5 - 5.1 mmol/L   Chloride 99 98 - 111 mmol/L   CO2 19 (L) 22 - 32 mmol/L   Glucose, Bld 485 (H) 70 - 99 mg/dL   BUN 17 6 - 20 mg/dL   Creatinine, Ser 1.20 0.61 - 1.24 mg/dL   Calcium 9.7 8.9 - 10.3 mg/dL   GFR calc non Af Amer >60 >60 mL/min   GFR calc Af Amer >60 >60 mL/min   Anion gap 20 (H) 5 - 15  Troponin I - ONCE - STAT  Result Value Ref Range   Troponin I <0.03 <0.03 ng/mL  CBG monitoring, ED  Result Value Ref Range   Glucose-Capillary 454 (H) 70 - 99 mg/dL    Ct Angio Chest Pe W And/or Wo Contrast Result Date: 12/25/2018 CLINICAL DATA:  Shortness  of breath and fatigue 2 days. Occasional cough. EXAM: CT ANGIOGRAPHY CHEST WITH CONTRAST TECHNIQUE: Multidetector CT imaging of the chest was performed using the standard protocol during bolus administration of intravenous contrast. Multiplanar CT image reconstructions and MIPs were obtained to evaluate the vascular anatomy. CONTRAST:  100mL OMNIPAQUE IOHEXOL 350 MG/ML SOLN COMPARISON:  Thoracic/lumbar spine CT 05/28/2009 and abdominal CT 05/04/2009 FINDINGS: Cardiovascular: Heart is normal size. Thoracic aorta is normal caliber. Pulmonary arterial system is well opacified and otherwise normal. Remaining vascular structures are unremarkable. Mediastinum/Nodes: No mediastinal or hilar adenopathy. Remaining mediastinal structures are normal. Mild prominence of the inferior left lobe of the thyroid. No axillary  adenopathy. Lungs/Pleura: The lungs are well inflated without consolidation or effusion. There are several small bilateral subcentimeter peripheral nodules unchanged from 2010 and therefore benign. Airways are normal. Upper Abdomen: No acute findings. Musculoskeletal: Minimal degenerative change of the spine. Review of the MIP images confirms the above findings. IMPRESSION: No acute cardiopulmonary disease and no evidence of pulmonary embolism. Electronically Signed   By: Elberta Fortisaniel  Boyle M.D.   On: 12/25/2018 08:43      Samuel JesterMcManus, Ardyn [place]ge, DO 12/25/18 1017

## 2018-12-25 NOTE — ED Triage Notes (Addendum)
Pt arrived stating that starting two days ago he has had some shortness of breath and fatigue. Pt reports an occasional cough. Denies any exposure to anyone sick. Pt in no distress at this time

## 2018-12-25 NOTE — ED Notes (Signed)
ED TO INPATIENT HANDOFF REPORT  Name/Age/Gender Stephen BostonJohn Knight 53 y.o. male  Code Status    Code Status Orders  (From admission, onward)         Start     Ordered   12/25/18 0950  Full code  Continuous     12/25/18 0949        Code Status History    Date Active Date Inactive Code Status Order ID Comments User Context   12/13/2016 1600 12/20/2016 1526 Full Code [id number]  Heloise PurpuraBorden, Lester, MD Inpatient   12/13/2016 1600 12/13/2016 1600 Full Code [id number]  Casey BurkittFitzgerald, Hillary Moen, MD Inpatient   Advance Care Planning Activity      Home/SNF/Other Home Chief Complaint Fatigue; SOB  Level of Care/Admitting Diagnosis ED Disposition    ED Disposition Condition Comment   Admit  Hospital Area: [facility] [[id number]]  Level of Care: Stepdown [14]  Admit to SDU based on following criteria: Hemodynamic compromise or significant risk of instability:  Patient requiring short term acute titration and management of vasoactive drips, and invasive monitoring (i.e., CVP and Arterial line).  Covid Evaluation: Screening Protocol (No Symptoms)  Diagnosis: DKA (diabetic ketoacidoses) [facility](HCC) [id number]]  Admitting Physician: Burnadette PopADHIKARI, AMRIT [[id number]]  Attending Physician: Burnadette PopADHIKARI, AMRIT [[id number]]  Estimated length of stay: past midnight tomorrow  Certification:: I certify this patient will need inpatient services for at least 2 midnights  PT Class (Do Not Modify): Inpatient [101]  PT Acc Code (Do Not Modify): Private [1]       Medical History History reviewed. No pertinent past medical history.  Allergies No Known Allergies  IV Location/Drains/Wounds Patient Lines/Drains/Airways Status   Active Line/Drains/Airways    Name:   Placement date:   Placement time:   Site:   Days:   Peripheral IV 12/25/18 Left;Upper Forearm   12/25/18    1045    Forearm   less than 1   Peripheral IV 12/25/18 Left;Distal Forearm   12/25/18    1046    Forearm   less than 1           Labs/Imaging Results for orders placed or performed during the hospital encounter of 12/25/18 (from the past 48 hour(s))  CBC with Differential/Platelet     Status: None   Collection Time: 12/25/18  6:15 AM  Result Value Ref Range   WBC 5.8 4.0 - 10.5 K/uL   RBC 5.54 4.22 - 5.81 MIL/uL   Hemoglobin 16.6 13.0 - 17.0 g/dL   HCT 41.350.0 24.439.0 - 01.052.0 %   MCV 90.3 80.0 - 100.0 fL   MCH 30.0 26.0 - 34.0 pg   MCHC 33.2 30.0 - 36.0 g/dL   RDW 27.213.0 53.611.5 - 64.415.5 %   Platelets 248 150 - 400 K/uL   nRBC 0.0 0.0 - 0.2 %   Neutrophils Relative % 39 %   Neutro Abs 2.3 1.7 - 7.7 K/uL   Lymphocytes Relative 51 %   Lymphs Abs 3.0 0.7 - 4.0 K/uL   Monocytes Relative 9 %   Monocytes Absolute 0.5 0.1 - 1.0 K/uL   Eosinophils Relative 1 %   Eosinophils Absolute 0.0 0.0 - 0.5 K/uL   Basophils Relative 0 %   Basophils Absolute 0.0 0.0 - 0.1 K/uL   Immature Granulocytes 0 %   Abs Immature Granulocytes 0.01 0.00 - 0.07 K/uL    Comment: Performed at [facility], [address] [place], [place] [address]  Basic metabolic panel     Status: Abnormal  Collection Time: 12/25/18  6:15 AM  Result Value Ref Range   Sodium 138 135 - 145 mmol/L   Potassium 4.5 3.5 - 5.1 mmol/L   Chloride 99 98 - 111 mmol/L   CO2 19 (L) 22 - 32 mmol/L   Glucose, Bld 485 (H) 70 - 99 mg/dL   BUN 17 6 - 20 mg/dL   Creatinine, Ser 1.611.20 0.61 - 1.24 mg/dL   Calcium 9.7 8.9 - 09.610.3 mg/dL   GFR calc non Af Amer >60 >60 mL/min   GFR calc Af Amer >60 >60 mL/min   Anion gap 20 (H) 5 - 15    Comment: Performed at [facility], [address] [place], [place] [address]  Troponin I - ONCE - STAT     Status: None   Collection Time: 12/25/18  6:15 AM  Result Value Ref Range   Troponin I <0.03 <0.03 ng/mL    Comment: Performed at [facility], [address] [place], [place] [address]  Hemoglobin A1c     Status: Abnormal   Collection Time: 12/25/18  6:15 AM  Result Value Ref  Range   Hgb A1c MFr Bld 14.9 (H) 4.8 - 5.6 %    Comment: (NOTE) Pre diabetes:          5.7%-6.4% Diabetes:              >6.4% Glycemic control for   <7.0% adults with diabetes    Mean Plasma Glucose 380.93 mg/dL    Comment: Performed at [facility], [address] [place], [place] [address]  CBG monitoring, ED     Status: Abnormal   Collection Time: 12/25/18  9:21 AM  Result Value Ref Range   Glucose-Capillary 454 (H) 70 - 99 mg/dL  Basic metabolic panel     Status: Abnormal   Collection Time: 12/25/18 10:42 AM  Result Value Ref Range   Sodium 139 135 - 145 mmol/L   Potassium 4.8 3.5 - 5.1 mmol/L   Chloride 103 98 - 111 mmol/L   CO2 18 (L) 22 - 32 mmol/L   Glucose, Bld 430 (H) 70 - 99 mg/dL   BUN 15 6 - 20 mg/dL   Creatinine, Ser 9.561.11 0.61 - 1.24 mg/dL   Calcium 9.2 8.9 - 21.310.3 mg/dL   GFR calc non Af Amer >60 >60 mL/min   GFR calc Af Amer >60 >60 mL/min   Anion gap 18 (H) 5 - 15    Comment: Performed at [facility], [address] [place], [place] [address]  CBG monitoring, ED     Status: Abnormal   Collection Time: 12/25/18 10:50 AM  Result Value Ref Range   Glucose-Capillary 383 (H) 70 - 99 mg/dL   Ct Angio Chest Pe W And/or Wo Contrast  Result Date: 12/25/2018 CLINICAL DATA:  Shortness of breath and fatigue 2 days. Occasional cough. EXAM: CT ANGIOGRAPHY CHEST WITH CONTRAST TECHNIQUE: Multidetector CT imaging of the chest was performed using the standard protocol during bolus administration of intravenous contrast. Multiplanar CT image reconstructions and MIPs were obtained to evaluate the vascular anatomy. CONTRAST:  100mL OMNIPAQUE IOHEXOL 350 MG/ML SOLN COMPARISON:  Thoracic/lumbar spine CT 05/28/2009 and abdominal CT 05/04/2009 FINDINGS: Cardiovascular: Heart is normal size. Thoracic aorta is normal caliber. Pulmonary arterial system is well opacified and otherwise normal. Remaining vascular structures are unremarkable. Mediastinum/Nodes: No  mediastinal or hilar adenopathy. Remaining mediastinal structures are normal. Mild prominence of the inferior left lobe of  the thyroid. No axillary adenopathy. Lungs/Pleura: The lungs are well inflated without consolidation or effusion. There are several small bilateral subcentimeter peripheral nodules unchanged from 2010 and therefore benign. Airways are normal. Upper Abdomen: No acute findings. Musculoskeletal: Minimal degenerative change of the spine. Review of the MIP images confirms the above findings. IMPRESSION: No acute cardiopulmonary disease and no evidence of pulmonary embolism. Electronically Signed   By: Marin Olp M.D.   On: 12/25/2018 08:43    Pending Labs Unresulted Labs (From admission, onward)    Start     Ordered   12/26/18 2130  Basic metabolic panel  Tomorrow morning,   R     12/25/18 0949   12/26/18 0500  Lipid panel  Tomorrow morning,   R     12/25/18 1010   12/25/18 8657  Basic metabolic panel  Now then every 4 hours,   R (with STAT occurrences)     12/25/18 0950   12/25/18 0949  HIV antibody (Routine Testing)  Once,   STAT     12/25/18 0949   12/25/18 0603  Novel Coronavirus,NAA,(SEND-OUT TO REF LAB - TAT 24-48 hrs); Hosp Order  (Asymptomatic Patients Labs)  Once,   STAT    Question:  Rule Out  Answer:  Yes   12/25/18 0602          Vitals/Pain Today's Vitals   12/25/18 0752 12/25/18 0830 12/25/18 0930 12/25/18 1030  BP:  124/80 120/78 108/61  Pulse:  89 89 85  Resp:  15 (!) 21 19  Temp:      TempSrc:      SpO2:  99% 98% 99%  Height:      PainSc: 0-No pain       Isolation Precautions Droplet and Contact precautions  Medications Medications  ketorolac (TORADOL) 30 MG/ML injection 15 mg (15 mg Intravenous Refused 12/25/18 0646)  sodium chloride (PF) 0.9 % injection (0 mLs  Hold 12/25/18 0752)  dextrose 5 %-0.45 % sodium chloride infusion ( Intravenous Hold 12/25/18 1011)  insulin regular bolus via infusion 0-10 Units (has no administration in time  range)  insulin regular, human (MYXREDLIN) 100 units/ 100 mL infusion (has no administration in time range)  dextrose 50 % solution 25 mL (has no administration in time range)  0.9 %  sodium chloride infusion (has no administration in time range)  enoxaparin (LOVENOX) injection 40 mg (has no administration in time range)  iohexol (OMNIPAQUE) 350 MG/ML injection 100 mL (100 mLs Intravenous Contrast Given 12/25/18 0759)  sodium chloride 0.9 % bolus 1,000 mL (0 mLs Intravenous Stopped 12/25/18 1044)    Mobility walks

## 2018-12-25 NOTE — ED Notes (Signed)
Pt was given contract for isolation until COVID test is resulted.

## 2018-12-25 NOTE — Progress Notes (Signed)
Inpatient Diabetes Program Recommendations  AACE/ADA: New Consensus Statement on Inpatient Glycemic Control (2015)  Target Ranges:  Prepandial:   less than 140 mg/dL      Peak postprandial:   less than 180 mg/dL (1-2 hours)      Critically ill patients:  140 - 180 mg/dL   Lab Results  Component Value Date   GLUCAP 144 (H) 12/25/2018   HGBA1C 14.9 (H) 12/25/2018    Review of Glycemic Control  Diabetes history: None Outpatient Diabetes medications: N/A Current orders for Inpatient glycemic control: IV insulin per DKA order set  HgbA1C - 14.9%  AG - 11   CO2 - 21 Ordered Living Well with Diabetes book Transitioning to Lantus 10 units QD, Novolog 0-15 units tidwc and hs  Pt will likely need to go home on insulin with HgbA1C of 14.9%.  Inpatient Diabetes Program Recommendations:     Lantus 20 units Q24H Novolog 4 units tidwc for meal coverage insulin if pt eats > 50% meal Will order OP Diabetes Education consult for new-onset DM  Pt does not have insurance. Will need affordable insulin for home.  No PCP - case manager consult for PCP and medications.  Will speak with pt in am regarding new diagnosis and going home on insulin.  Thank you. Lorenda Peck, RD, LDN, CDE Inpatient Diabetes Coordinator [phone]

## 2018-12-25 NOTE — ED Notes (Signed)
Report given to [facility], RN for 2w, 1235.
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
## 2018-12-26 ENCOUNTER — Telehealth: Payer: Self-pay

## 2018-12-26 DIAGNOSIS — E669 Obesity, unspecified: Secondary | ICD-10-CM

## 2018-12-26 DIAGNOSIS — E1169 Type 2 diabetes mellitus with other specified complication: Secondary | ICD-10-CM

## 2018-12-26 DIAGNOSIS — E1165 Type 2 diabetes mellitus with hyperglycemia: Secondary | ICD-10-CM

## 2018-12-26 DIAGNOSIS — E785 Hyperlipidemia, unspecified: Secondary | ICD-10-CM

## 2018-12-26 LAB — CBC WITH DIFFERENTIAL/PLATELET
Abs Immature Granulocytes: 0.01 10*3/uL (ref 0.00–0.07)
Basophils Absolute: 0 10*3/uL (ref 0.0–0.1)
Basophils Relative: 0 %
Eosinophils Absolute: 0 10*3/uL (ref 0.0–0.5)
Eosinophils Relative: 1 %
HCT: 43.4 % (ref 39.0–52.0)
Hemoglobin: 14.1 g/dL (ref 13.0–17.0)
Immature Granulocytes: 0 %
Lymphocytes Relative: 36 %
Lymphs Abs: 1.7 10*3/uL (ref 0.7–4.0)
MCH: 30.6 pg (ref 26.0–34.0)
MCHC: 32.5 g/dL (ref 30.0–36.0)
MCV: 94.1 fL (ref 80.0–100.0)
Monocytes Absolute: 0.3 10*3/uL (ref 0.1–1.0)
Monocytes Relative: 7 %
Neutro Abs: 2.6 10*3/uL (ref 1.7–7.7)
Neutrophils Relative %: 56 %
Platelets: 187 10*3/uL (ref 150–400)
RBC: 4.61 MIL/uL (ref 4.22–5.81)
RDW: 13.3 % (ref 11.5–15.5)
WBC: 4.7 10*3/uL (ref 4.0–10.5)
nRBC: 0 % (ref 0.0–0.2)

## 2018-12-26 LAB — BASIC METABOLIC PANEL
Anion gap: 11 (ref 5–15)
Anion gap: 12 (ref 5–15)
BUN: 11 mg/dL (ref 6–20)
BUN: 12 mg/dL (ref 6–20)
CO2: 21 mmol/L — ABNORMAL LOW (ref 22–32)
CO2: 19 mmol/L — ABNORMAL LOW (ref 22–32)
Calcium: 8.6 mg/dL — ABNORMAL LOW (ref 8.9–10.3)
Calcium: 8.7 mg/dL — ABNORMAL LOW (ref 8.9–10.3)
Chloride: 106 mmol/L (ref 98–111)
Chloride: 105 mmol/L (ref 98–111)
Creatinine, Ser: 0.97 mg/dL (ref 0.61–1.24)
Creatinine, Ser: 0.99 mg/dL (ref 0.61–1.24)
GFR calc Af Amer: >60 mL/min (ref >60)
GFR calc Af Amer: >60 mL/min (ref >60)
GFR calc non Af Amer: >60 mL/min (ref >60)
GFR calc non Af Amer: >60 mL/min (ref >60)
Glucose, Bld: 217 mg/dL — ABNORMAL HIGH (ref 70–99)
Glucose, Bld: 292 mg/dL — ABNORMAL HIGH (ref 70–99)
Potassium: 3.7 mmol/L (ref 3.5–5.1)
Potassium: 3.9 mmol/L (ref 3.5–5.1)
Sodium: 138 mmol/L (ref 135–145)
Sodium: 136 mmol/L (ref 135–145)

## 2018-12-26 LAB — LIPID PANEL
Cholesterol: 263 mg/dL — ABNORMAL HIGH (ref 0–200)
HDL: 43 mg/dL (ref >40)
LDL Cholesterol: 195 mg/dL — ABNORMAL HIGH (ref 0–99)
Total CHOL/HDL Ratio: 6.1 RATIO
Triglycerides: 127 mg/dL (ref <150)
VLDL: 25 mg/dL (ref 0–40)

## 2018-12-26 LAB — HEPATIC FUNCTION PANEL
ALT: 20 U/L (ref 0–44)
AST: 20 U/L (ref 15–41)
Albumin: 3.6 g/dL (ref 3.5–5.0)
Alkaline Phosphatase: 70 U/L (ref 38–126)
Bilirubin, Direct: 0.1 mg/dL (ref 0.0–0.2)
Indirect Bilirubin: 1.6 mg/dL — ABNORMAL HIGH (ref 0.3–0.9)
Total Bilirubin: 1.7 mg/dL — ABNORMAL HIGH (ref 0.3–1.2)
Total Protein: 6.8 g/dL (ref 6.5–8.1)

## 2018-12-26 LAB — NOVEL CORONAVIRUS, NAA (HOSP ORDER, SEND-OUT TO REF LAB; TAT 18-24 HRS): SARS-CoV-2, NAA: NOT DETECTED

## 2018-12-26 LAB — GLUCOSE, CAPILLARY
Glucose-Capillary: 132 mg/dL — ABNORMAL HIGH (ref 70–99)
Glucose-Capillary: 153 mg/dL — ABNORMAL HIGH (ref 70–99)
Glucose-Capillary: 257 mg/dL — ABNORMAL HIGH (ref 70–99)
Glucose-Capillary: 276 mg/dL — ABNORMAL HIGH (ref 70–99)
Glucose-Capillary: 352 mg/dL — ABNORMAL HIGH (ref 70–99)

## 2018-12-26 LAB — MAGNESIUM: Magnesium: 2.1 mg/dL (ref 1.7–2.4)

## 2018-12-26 LAB — HIV ANTIBODY (ROUTINE TESTING W REFLEX): HIV Screen 4th Generation wRfx: NONREACTIVE

## 2018-12-26 LAB — PHOSPHORUS: Phosphorus: 2.3 mg/dL — ABNORMAL LOW (ref 2.5–4.6)

## 2018-12-26 MED ORDER — INSULIN GLARGINE 100 UNIT/ML ~~LOC~~ SOLN
25.0000 [IU] | Freq: Every day | SUBCUTANEOUS | Status: DC
  Administered 2018-12-27: 25 [IU] via SUBCUTANEOUS
  Filled 2018-12-26: qty 0.25

## 2018-12-26 MED ORDER — INSULIN ASPART 100 UNIT/ML ~~LOC~~ SOLN
4.0000 [IU] | Freq: Three times a day (TID) | SUBCUTANEOUS | Status: DC
  Administered 2018-12-26 (×2): 4 [IU] via SUBCUTANEOUS

## 2018-12-26 MED ORDER — INSULIN ASPART 100 UNIT/ML ~~LOC~~ SOLN
8.0000 [IU] | Freq: Three times a day (TID) | SUBCUTANEOUS | Status: DC
  Administered 2018-12-26 – 2018-12-27 (×3): 8 [IU] via SUBCUTANEOUS

## 2018-12-26 MED ORDER — K PHOS MONO-SOD PHOS DI & MONO 155-852-130 MG PO TABS
500.0000 mg | ORAL_TABLET | Freq: Two times a day (BID) | ORAL | Status: AC
  Administered 2018-12-26 (×2): 500 mg via ORAL
  Filled 2018-12-26 (×2): qty 2

## 2018-12-26 MED ORDER — SODIUM CHLORIDE 0.9 % IV BOLUS
1000.0000 mL | Freq: Once | INTRAVENOUS | Status: AC
  Administered 2018-12-26: 1000 mL via INTRAVENOUS

## 2018-12-26 MED ORDER — ATORVASTATIN CALCIUM 40 MG PO TABS: 40.0000 mg | ORAL_TABLET | Freq: Every day | ORAL | 0 refills | Status: DC

## 2018-12-26 MED ORDER — ATORVASTATIN CALCIUM 40 MG PO TABS
40.0000 mg | ORAL_TABLET | Freq: Every day | ORAL | Status: DC
  Administered 2018-12-26: 40 mg via ORAL
  Filled 2018-12-26: qty 1

## 2018-12-26 MED ORDER — INSULIN GLARGINE 100 UNIT/ML ~~LOC~~ SOLN
20.0000 [IU] | Freq: Every day | SUBCUTANEOUS | Status: DC
  Administered 2018-12-26: 20 [IU] via SUBCUTANEOUS
  Filled 2018-12-26: qty 0.2

## 2018-12-26 MED ORDER — INSULIN STARTER KIT- PEN NEEDLES (ENGLISH)
1.0000 | Freq: Once | Status: AC
  Administered 2018-12-26: 1
  Filled 2018-12-26: qty 1

## 2018-12-26 NOTE — Telephone Encounter (Signed)
Call received from Purcell Mouton, RN CM requesting a hospital follow up appointment for the patient @ [facility].   Informed her that an appointment has been scheduled for 01/07/2019 @ 1430.

## 2018-12-26 NOTE — Progress Notes (Signed)
PROGRESS NOTE    Stephen Knight  SEG:[id number] DOB: [birth year] DOA: 12/25/2018 PCP: Marliss Coots, NP   Brief Narrative:  HPI per Dr. Shelly Coss on 12/25/2018 Stephen Knight is a 53 y.o. male with no significant past medical history who comes to the emergency department with several complaints.  Patient was having weakness, dry mouth, blurry vision and some shortness of breath.  He had these problems since last couple of days.  Patient is usually healthy male without any medical problems.  He does not take any medicines at home besides vitamin  supplements.  He said he was starting a new work form today in [employer].  Patient denies any contact with people who were diagnosed with COVID-19. Patient states he was peeing a lot, his mouth was dry and he was very thirsty.  He said he also lost 2-3 pounds since last month.  He also reported some subjective feeling of shortness of breath and cough. CT angiogram done in the emergency department did not show any pulmonary embolism, no pneumonia.  BMP showed increased blood sugar, increased anion gap. Patient seen and examined the bedside in the emergency department.  He denies any fever, chills, chest pain, palpitations, dysuria, nausea, vomiting, diarrhea or headache.  ED Course: Diagnosed with DKA.  Started on insulin drip.  **Interim History Patient is gap improved and he was transitioned off of insulin drip to long-acting insulin last night.  We will continue to adjust insulin medications pending his blood sugar results.  Will need to find a regimen for discharge and have asked for case management assistance.  Case management to let us know that they can obtain the Lantus in the a.m. and if not patient will be going home on 7030.  We will continue to monitor patient blood glucose very carefully  Assessment & Plan:   Principal Problem:   DKA (diabetic ketoacidoses) ([place]) Active Problems:   DM (diabetes mellitus) ([place])  DKA, improving   -Started on insulin drip and transition to long-acting  -Monitored BMP every 4 hours and last gap was 12 with a CO2 of 19.  -Kept him n.p.o but now on a Diabetic Diet   -Continue to adjust Insulin as below  -Gap is closed and he was transitioned -Initial culture test was negative it is unclear why this was repeated and was sent out -We will stop IV fluid hydration now with normal saline  New onset Uncontrolled Dabetes Mellitus -Likely type 2 DM. -Hemoglobin A1c level was 14.9   -Diabetic coordinator consulted.   -Needs diabetic education.  -Discharge management plan as per hemoglobin A1c level and will need insulin on discharge. Will need Case Management assistance on whether he can get Lantus or if he will need 70/30 -Continue Diabetic Teaching' -Blood Sugars ranging from 108-352 today and will need to control blood sugars better prior to D/C -Long-acting insulin and have increased the dose to 25 units of Lantus daily -Continue with moderate NovoLog sliding scale insulin AC and at bedtime as well as pre-meal insulin 8 units 3 times daily with meals -Continue make adjustments of insulin as necessary  Hyperlipidemia -Lipid panel done and showed a total cholesterol/HDL ratio 6.1, cholesterol level 263, HDL 43, 8 LDL 195, triglycerides 127, and VLDL 25 -We will start on atorvastatin 40 mg p.o. nightly  Obesity -Estimated body mass index is 33.91 kg/m as calculated from the following:   Height as of this encounter: 6' (1.829 m).   Weight as of this encounter: 113.4  kg. -Weight loss and dietary counseling given  Hypophosphatemia -Patient's Phos Level was 2.3 -Replete with p.o. K-Phos Neutral 5 mg p.o. twice daily x2 doses -Continue monitor and Replete as Necessary -Repeat phosphorus level in a.m.  Hyperbilirubinemia -Patient's T bili was elevated at 1.7 and indirect was 1.6 and direct bilirubin was 0.1 -Continue monitor and trend and repeat CMP in a.m.  Constipation -Continue  with docusate 200 mg p.o. nightly along with attempting grams of MiraLAX daily PRN  DVT prophylaxis: Enoxaparin 40 mg sq q24h Code Status: FULL CODE  Family Communication: No family present at bedside  Disposition Plan: Anticipate discharging in the next 24 to 48 hours back to home if can get insulin set up  Consultants:   Diabetes Education Coordinator    Procedures: None   Antimicrobials:  Anti-infectives (From admission, onward)   None     Subjective: Seen and examined at bedside states that he is felt better after having abdominal.  No chest pain, headaches or dizziness.  States he feels better and denies any current complaints.  No nausea or vomiting.  No abdominal pain noted.  Motivated to try to get diabetes under control.  Objective: Vitals:   12/26/18 0751 12/26/18 0805 12/26/18 0836 12/26/18 1216  BP:   (!) 133/96 105/66  Pulse: 60  76 75  Resp: '14  18 16  ' Temp:  97.8 F (36.6 C) 98.2 F (36.8 C) 98 F (36.7 C)  TempSrc:  Oral Oral Oral  SpO2: 97%  100% 99%  Weight:      Height:        Intake/Output Summary (Last 24 hours) at 12/26/2018 1703 Last data filed at 12/26/2018 0745 Gross per 24 hour  Intake 609.44 ml  Output 300 ml  Net 309.44 ml   Filed Weights   12/25/18 1200  Weight: 113.4 kg   Examination: Physical Exam:  Constitutional: WN/WD obese AAM in NAD and appears calm and comfortable Eyes: Lids and conjunctivae normal, sclerae anicteric  ENMT: External Ears, Nose appear normal. Grossly normal hearing.  Neck: Appears normal, supple, no cervical masses, normal ROM, no appreciable thyromegaly; no JVD Respiratory: Diminished to auscultation bilaterally, no wheezing, rales, rhonchi or crackles. Normal respiratory effort and patient is not tachypenic. No accessory muscle use.  Cardiovascular: RRR, no murmurs / rubs / gallops. S1 and S2 auscultated. Trace extremity edema. Abdomen: Soft, non-tender, Distended due to body habitus. No masses palpated.  No appreciable hepatosplenomegaly. Bowel sounds positive x4.  GU: Deferred. Musculoskeletal: No clubbing / cyanosis of digits/nails. No joint deformity upper and lower extremities Skin: No rashes, lesions, ulcers on a limited skin evaluation. No induration; Warm and dry.  Neurologic: CN 2-12 grossly intact with no focal deficits. Romberg sign and cerebellar reflexes not assessed.  Psychiatric: Normal judgment and insight. Alert and oriented x 3. Normal mood and appropriate affect.   Data Reviewed: I have personally reviewed following labs and imaging studies  CBC: Recent Labs  Lab 12/25/18 0615 12/26/18 0920  WBC 5.8 4.7  NEUTROABS 2.3 2.6  HGB 16.6 14.1  HCT 50.0 43.4  MCV 90.3 94.1  PLT 248 680   Basic Metabolic Panel: Recent Labs  Lab 12/25/18 1042 12/25/18 1416 12/25/18 1754 12/26/18 0248 12/26/18 0920  NA 139 140 139 138 136  K 4.8 4.1 3.8 3.7 3.9  CL 103 104 107 106 105  CO2 18* 20* 21* 21* 19*  GLUCOSE 430* 296* 151* 217* 292*  BUN '15 13 11 11 12  ' CREATININE 1.11  1.00 0.94 0.97 0.99  CALCIUM 9.2 9.2 8.9 8.6* 8.7*  MG  --   --   --   --  2.1  PHOS  --   --   --   --  2.3*   GFR: Estimated Creatinine Clearance: 116 mL/min (by C-G formula based on SCr of 0.99 mg/dL). Liver Function Tests: Recent Labs  Lab 12/26/18 0920  AST 20  ALT 20  ALKPHOS 70  BILITOT 1.7*  PROT 6.8  ALBUMIN 3.6   No results for input(s): LIPASE, AMYLASE in the last 168 hours. No results for input(s): AMMONIA in the last 168 hours. Coagulation Profile: No results for input(s): INR, PROTIME in the last 168 hours. Cardiac Enzymes: Recent Labs  Lab 12/25/18 0615  TROPONINI <0.03   BNP (last 3 results) No results for input(s): PROBNP in the last 8760 hours. HbA1C: Recent Labs    12/25/18 0615  HGBA1C 14.9*   CBG: Recent Labs  Lab 12/25/18 2322 12/26/18 0003 12/26/18 0806 12/26/18 1214 12/26/18 1620  GLUCAP 177* 132* 257* 352* 276*   Lipid Profile: Recent Labs     12/26/18 0248  CHOL 263*  HDL 43  LDLCALC 195*  TRIG 127  CHOLHDL 6.1   Thyroid Function Tests: No results for input(s): TSH, T4TOTAL, FREET4, T3FREE, THYROIDAB in the last 72 hours. Anemia Panel: No results for input(s): VITAMINB12, FOLATE, FERRITIN, TIBC, IRON, RETICCTPCT in the last 72 hours. Sepsis Labs: No results for input(s): PROCALCITON, LATICACIDVEN in the last 168 hours.  Recent Results (from the past 240 hour(s))  Novel Coronavirus,NAA,(SEND-OUT TO REF LAB - TAT 24-48 hrs); Hosp Order     Status: None   Collection Time: 12/25/18  6:15 AM   Specimen: Nasopharyngeal Swab; Respiratory  Result Value Ref Range Status   SARS-CoV-2, NAA NOT DETECTED NOT DETECTED Final    Comment: (NOTE) This test was developed and its performance characteristics determined by [employer]. This test has not been FDA cleared or approved. This test has been authorized by FDA under an Emergency Use Authorization (EUA). This test is only authorized for the duration of time the declaration that circumstances exist justifying the authorization of the emergency use of in vitro diagnostic tests for detection of SARS-CoV-2 virus and/or diagnosis of COVID-19 infection under section 564(b)(1) of the Act, 21 U.S.C. 409WJX-9(J)(4), unless the authorization is terminated or revoked sooner. When diagnostic testing is negative, the possibility of a false negative result should be considered in the context of a patient's recent exposures and the presence of clinical signs and symptoms consistent with COVID-19. An individual without symptoms of COVID-19 and who is not shedding SARS-CoV-2 virus would expect to have a negative (not detected) result in this assay. Performed  At: [facility] [place], [place] [id number] Rush Farmer MD YQ:[id number]    [place]  Final    Comment: Performed at [facility] [address],  [place], [place] [address]  MRSA PCR Screening     Status: None   Collection Time: 12/25/18 12:04 PM   Specimen: Nasal Mucosa; Nasopharyngeal  Result Value Ref Range Status   MRSA by PCR NEGATIVE NEGATIVE Final    Comment:        The GeneXpert MRSA Assay (FDA approved for NASAL specimens only), is one component of a comprehensive MRSA colonization surveillance program. It is not intended to diagnose MRSA infection nor to guide or monitor treatment for MRSA infections. Performed at [facility], [address] [place], [place] [address]     Radiology Studies: Ct Angio Chest Pe W And/or Wo Contrast  Result Date: 12/25/2018 CLINICAL DATA:  Shortness of breath and fatigue 2 days. Occasional cough. EXAM: CT ANGIOGRAPHY CHEST WITH CONTRAST TECHNIQUE: Multidetector CT imaging of the chest was performed using the standard protocol during bolus administration of intravenous contrast. Multiplanar CT image reconstructions and MIPs were obtained to evaluate the vascular anatomy. CONTRAST:  128m OMNIPAQUE IOHEXOL 350 MG/ML SOLN COMPARISON:  Thoracic/lumbar spine CT 05/28/2009 and abdominal CT 05/04/2009 FINDINGS: Cardiovascular: Heart is normal size. Thoracic aorta is normal caliber. Pulmonary arterial system is well opacified and otherwise normal. Remaining vascular structures are unremarkable. Mediastinum/Nodes: No mediastinal or hilar adenopathy. Remaining mediastinal structures are normal. Mild prominence of the inferior left lobe of the thyroid. No axillary adenopathy. Lungs/Pleura: The lungs are well inflated without consolidation or effusion. There are several small bilateral subcentimeter peripheral nodules unchanged from 2010 and therefore benign. Airways are normal. Upper Abdomen: No acute findings. Musculoskeletal: Minimal degenerative change of the spine. Review of the MIP images confirms the above findings. IMPRESSION: No acute cardiopulmonary disease and no evidence of pulmonary  embolism. Electronically Signed   By: DMarin OlpM.D.   On: 12/25/2018 08:43   Scheduled Meds:  atorvastatin  40 mg Oral q1800   docusate sodium  200 mg Oral QHS   enoxaparin (LOVENOX) injection  40 mg Subcutaneous Q24H   insulin aspart  0-15 Units Subcutaneous TID WC   insulin aspart  0-5 Units Subcutaneous QHS   insulin aspart  8 Units Subcutaneous TID WC   [START ON 12/27/2018] insulin glargine  25 Units Subcutaneous Daily   insulin starter kit- pen needles  1 kit Other Once   ketorolac  15 mg Intravenous Once   living well with diabetes book   Does not apply Once   phosphorus  500 mg Oral BID   Continuous Infusions:  sodium chloride 75 mL/hr at 12/25/18 2014    LOS: 1 day   OKerney Elbe DO [facility] PAGER is on AMION  If 7PM-7AM, please contact night-coverage [email or web address] Password [email or web address]6/18/2020, 5:03 PM

## 2018-12-26 NOTE — Progress Notes (Signed)
Inpatient Diabetes Program Recommendations  AACE/ADA: New Consensus Statement on Inpatient Glycemic Control (2015)  Target Ranges:  Prepandial:   less than 140 mg/dL      Peak postprandial:   less than 180 mg/dL (1-2 hours)      Critically ill patients:  140 - 180 mg/dL   Lab Results  Component Value Date   GLUCAP 352 (H) 12/26/2018   HGBA1C 14.9 (H) 12/25/2018    Review of Glycemic Control  Spoke with patient about new diabetes diagnosis.  Discussed A1C results (14.9%) and explained what an A1C is and informed patient that his current A1C indicates an average glucose of 370 mg/dl over the past 2-3 months. Discussed basic pathophysiology of DM Type 2, basic home care, importance of checking CBGs and maintaining good CBG control to prevent long-term and short-term complications. Reviewed glucose and A1C goals and explained that patient will need to check blood sugars 3-4x/day and f/u with PCP. Reviewed signs and symptoms of hyperglycemia and hypoglycemia along with treatment for both. Discussed impact of nutrition, exercise, stress, sickness, and medications on diabetes control. Reviewed Living Well with diabetes booklet and encouraged patient to read through entire book.   Educated patient on insulin pen use at home. Reviewed contents of insulin flexpen starter kit. Reviewed all steps if insulin pen including attachment of needle, 2-unit air shot, dialing up dose, giving injection, removing needle, disposal of sharps, storage of unused insulin, disposal of insulin etc. Patient able to provide successful return demonstration. Also reviewed troubleshooting with insulin pen. MD to give patient Rxs for insulin pens and insulin pen needles.  Care management consult for [facility] appt for new PCP. Hopefully pt will be able to get lantus and novolog pens at [place].  If unable to get Lantus/Novolog, 70/30 insulin could be used, along with Novolin R for sliding scale. Pt will still need appt for PCP.  Wife is a patient at [facility], so this may help get pt in.   Recommendations:  Increase Lantus to 25 units QD Increase Novolog to 8 units tidwc OP Diabetes Education consult for new-onset DM  Will need prescriptions for: Glucose meter kit - #[id number] Insulin pen needles - [phone]  Conversion to 70/30: 18 units bid  Thank you. Lorenda Peck, RD, LDN, CDE Inpatient Diabetes Coordinator [phone]

## 2018-12-27 LAB — CBC WITH DIFFERENTIAL/PLATELET
Abs Immature Granulocytes: 0.01 10*3/uL (ref 0.00–0.07)
Basophils Absolute: 0 10*3/uL (ref 0.0–0.1)
Basophils Relative: 0 %
Eosinophils Absolute: 0.1 10*3/uL (ref 0.0–0.5)
Eosinophils Relative: 2 %
HCT: 39.3 % (ref 39.0–52.0)
Hemoglobin: 12.9 g/dL — ABNORMAL LOW (ref 13.0–17.0)
Immature Granulocytes: 0 %
Lymphocytes Relative: 56 %
Lymphs Abs: 2.1 10*3/uL (ref 0.7–4.0)
MCH: 30.3 pg (ref 26.0–34.0)
MCHC: 32.8 g/dL (ref 30.0–36.0)
MCV: 92.3 fL (ref 80.0–100.0)
Monocytes Absolute: 0.3 10*3/uL (ref 0.1–1.0)
Monocytes Relative: 8 %
Neutro Abs: 1.3 10*3/uL — ABNORMAL LOW (ref 1.7–7.7)
Neutrophils Relative %: 34 %
Platelets: 174 10*3/uL (ref 150–400)
RBC: 4.26 MIL/uL (ref 4.22–5.81)
RDW: 13.2 % (ref 11.5–15.5)
WBC: 3.7 10*3/uL — ABNORMAL LOW (ref 4.0–10.5)
nRBC: 0 % (ref 0.0–0.2)

## 2018-12-27 LAB — NOVEL CORONAVIRUS, NAA (HOSP ORDER, SEND-OUT TO REF LAB; TAT 18-24 HRS): SARS-CoV-2, NAA: NOT DETECTED

## 2018-12-27 LAB — COMPREHENSIVE METABOLIC PANEL
ALT: 16 U/L (ref 0–44)
AST: 17 U/L (ref 15–41)
Albumin: 3 g/dL — ABNORMAL LOW (ref 3.5–5.0)
Alkaline Phosphatase: 61 U/L (ref 38–126)
Anion gap: 8 (ref 5–15)
BUN: 11 mg/dL (ref 6–20)
CO2: 21 mmol/L — ABNORMAL LOW (ref 22–32)
Calcium: 7.9 mg/dL — ABNORMAL LOW (ref 8.9–10.3)
Chloride: 101 mmol/L (ref 98–111)
Creatinine, Ser: 0.82 mg/dL (ref 0.61–1.24)
GFR calc Af Amer: >60 mL/min (ref >60)
GFR calc non Af Amer: >60 mL/min (ref >60)
Glucose, Bld: 262 mg/dL — ABNORMAL HIGH (ref 70–99)
Potassium: 3.2 mmol/L — ABNORMAL LOW (ref 3.5–5.1)
Sodium: 130 mmol/L — ABNORMAL LOW (ref 135–145)
Total Bilirubin: 1 mg/dL (ref 0.3–1.2)
Total Protein: 5.8 g/dL — ABNORMAL LOW (ref 6.5–8.1)

## 2018-12-27 LAB — MAGNESIUM: Magnesium: 2 mg/dL (ref 1.7–2.4)

## 2018-12-27 LAB — TSH: TSH: 1.426 u[IU]/mL (ref 0.350–4.500)

## 2018-12-27 LAB — GLUCOSE, CAPILLARY
Glucose-Capillary: 262 mg/dL — ABNORMAL HIGH (ref 70–99)
Glucose-Capillary: 266 mg/dL — ABNORMAL HIGH (ref 70–99)

## 2018-12-27 LAB — PHOSPHORUS: Phosphorus: 3.7 mg/dL (ref 2.5–4.6)

## 2018-12-27 MED ORDER — INSULIN ASPART 100 UNIT/ML FLEXPEN: 10.0000 [IU] | PEN_INJECTOR | Freq: Three times a day (TID) | SUBCUTANEOUS | 11 refills | Status: DC

## 2018-12-27 MED ORDER — SODIUM CHLORIDE 0.9 % IV BOLUS
500.0000 mL | Freq: Once | INTRAVENOUS | Status: AC
  Administered 2018-12-27: 500 mL via INTRAVENOUS

## 2018-12-27 MED ORDER — INSULIN PEN NEEDLE 31G X 5 MM MISC: 1.0000 | Freq: Four times a day (QID) | 0 refills | Status: DC

## 2018-12-27 MED ORDER — POTASSIUM CHLORIDE CRYS ER 20 MEQ PO TBCR
40.0000 meq | EXTENDED_RELEASE_TABLET | Freq: Two times a day (BID) | ORAL | Status: DC
  Administered 2018-12-27: 11:00:00 40 meq via ORAL
  Filled 2018-12-27: qty 2

## 2018-12-27 MED ORDER — INSULIN GLARGINE 100 UNITS/ML SOLOSTAR PEN: 28.0000 [IU] | PEN_INJECTOR | Freq: Every day | SUBCUTANEOUS | 11 refills | Status: DC

## 2018-12-27 MED ORDER — SODIUM CHLORIDE 0.9 % IV SOLN
INTRAVENOUS | Status: DC
  Administered 2018-12-27: 09:00:00 via INTRAVENOUS

## 2018-12-27 MED ORDER — BLOOD GLUCOSE MONITOR KIT: PACK | 0 refills | Status: AC

## 2018-12-27 MED FILL — !NOVOLOG FLEXPEN SYRINGE 1: 100/ML | 30 days supply | Qty: 9 | Fill #0

## 2018-12-27 MED FILL — ?BASAGLAR 100 UNITS/ML KWPE: 100 | 21 days supply | Qty: 6 | Fill #0

## 2018-12-27 MED FILL — !TRUE METRIX BLOOD GLUCOSE: 365 days supply | Qty: 1 | Fill #0

## 2018-12-27 MED FILL — TRUE METRIX TEST STRIP: 25 days supply | Qty: 100 | Fill #0

## 2018-12-27 MED FILL — TRUEplus LANCETS 28G MISC: 30 days supply | Qty: 100 | Fill #0

## 2018-12-27 NOTE — Progress Notes (Signed)
   12/27/18 1359  AVS Discharge Documentation  AVS Discharge Instructions Including Medications Provided to patient/caregiver  Name of Person Receiving AVS Discharge Instructions Including Medications Ova Freshwater  Name of Clinician That Reviewed AVS Discharge Instructions Including Medications Rich Reining

## 2018-12-27 NOTE — Progress Notes (Signed)
Inpatient Diabetes Program Recommendations  AACE/ADA: New Consensus Statement on Inpatient Glycemic Control (2015)  Target Ranges:  Prepandial:   less than 140 mg/dL      Peak postprandial:   less than 180 mg/dL (1-2 hours)      Critically ill patients:  140 - 180 mg/dL   Lab Results  Component Value Date   GLUCAP 266 (H) 12/27/2018   HGBA1C 14.9 (H) 12/25/2018    Review of Glycemic Control  Met with pt prior to discharge regarding his newly diagnosed DM. Review insulin administration, rotating sites, hypoglycemia s/s and treatment. Pt asked if blood sugars were really high, what to do. I told him if they are > 350 mg/dL to call [facility] and see if he could move up appt or MD can tell him if he wants him to take more insulin. Pt is very motivated to start exercising slowly, eating healthy and checking his blood sugars. Much encouragement given.   Discharge meds :  Lantus 28 units QD Novolog 10 units tidwc  Prescriptions for glucose meter kit, insulin pen needles, Lantus Solostar, and Novolog Flexpen.  [place] appt: 01/07/19 at 1430.  Discussed with MD and RN.  Thank you. Lorenda Peck, RD, LDN, CDE Inpatient Diabetes Coordinator [phone]

## 2018-12-27 NOTE — Discharge Summary (Signed)
Physician Discharge Summary  Maron Stanzione ZSW:[id number] DOB: [birth year] DOA: 12/25/2018  PCP: Marliss Coots, NP  Admit date: 12/25/2018 Discharge date: 12/28/2018  Admitted From: Home Disposition: Home  Recommendations for Outpatient Follow-up:  1. Follow up with PCP in 1-2 weeks; Appointment scheduled for 01/07/2019 at 14:30 2. Please obtain CMP/CBC, Mag, Phos in one week 3. Please follow up on the following pending results:  Home Health: No  Equipment/Devices: None  Discharge Condition: Stable CODE STATUS: FULL CODE Diet recommendation: Heart Healthy Carb Modified Diet   Brief/Interim Summary: HPI per Dr. Shelly Coss on 12/25/2018 Stephen Austinis a 53 y.o.malewithno significant past medical history who comes to the emergency department withseveral complaints. Patient was having weakness, dry mouth, blurry vision and some shortness of breath. He had theseproblemssince last couple of days. Patient is usually healthy male without any medical problems. He does not take any medicines at home besides vitaminsupplements. He said he was starting a new work form today in [employer]. Patient denies any contact with people who were diagnosed with COVID-19. Patient states he was peeing a lot, his mouth was dry and he was very thirsty. He said he also lost 2-3 poundssincelast month. He also reported some subjective feeling of shortness of breath and cough. CT angiogram done in the emergencydepartment did not show any pulmonary embolism, no pneumonia.BMP showed increased blood sugar, increased anion gap. Patient seen and examined the bedside in the emergency department. He denies any fever, chills, chest pain, palpitations, dysuria, nausea, vomiting, diarrhea or headache.  ED Course:Diagnosed with DKA. Started on insulin drip.  **Interim History Patient is gap improved and he was transitioned off of insulin drip to long-acting insulin last night.  Current regimen was  adjusted and patient was able to get the Lantus and Nov[facility]Log [facility]  Patient was deemed stable for discharge after diabetes coordinator saw the patient and adjustments were made.  Patient will be going home on Lantus 28 units and NovoLog 10 units 3 times daily with meals.  Appointment is scheduled for the patient for follow-up after discharge with [facility].  Discharge Diagnoses:  Principal Problem:   DKA (diabetic ketoacidoses) ([place]) Active Problems:   DM (diabetes mellitus) ([place])  DKA, improved -Started on insulin drip and transition to long-acting -Monitored BMP every 4 hours and last gap was 12 with a CO2 of 19.  -Kept him n.p.o but now on a Diabetic Diet  -Continue to adjust Insulin as below  -Gap is closed and he was transitioned -Initial culture test was negative it is unclear why this was repeated and was sent out -IV fluid was resumed prior to discharge but now stopped  New onset Uncontrolled Dabetes Mellitus -Likely type 2 DM. -Hemoglobin A1c level was 14.9  -Diabetic coordinator consulted.  -Needs diabetic education.  -Discharge management plan as per hemoglobin A1c level and will need insulin on discharge. Will need Case Management assistance on whether he can get Lantus or if he will need 70/30 -Continue Diabetic Teaching' -Blood Sugars ranging from 153-266 -Long-acting insulin and have increased the dose to 28 units of Lantus daily -Continue with moderate NovoLog sliding scale insulin AC and at bedtime as well as pre-meal insulin 10 units 3 times daily with meals -Continue make adjustments of insulin as necessary in the outpatient setting with PCP  Hyperlipidemia -Lipid panel done and showed a total cholesterol/HDL ratio 6.1, cholesterol level 263, HDL 43, 8 LDL 195, triglycerides 127, and VLDL  25 -We will start on atorvastatin 40 mg p.o. nightly  Obesity -Estimated body mass index is 33.91 kg/m  as calculated from the following:   Height as of this encounter: 6' (1.829 m).   Weight as of this encounter: 113.4 kg. -Weight loss and dietary counseling given  Hypophosphatemia -Patient's Phos Level was 2.3 and is now 3.7 -Replete with p.o. K-Phos Neutral 5 mg p.o. twice daily x2 doses -Continue monitor and Replete as Necessary -Repeat phosphorus level as an outpatient  Hyperbilirubinemia -Patient's T bili was elevated at 1.7 and indirect was 1.6 and direct bilirubin was 0.1; repeat T bili this morning was 1.0 -Continue monitor and trend and repeat CMP as an outpatient  Constipation -Continue with docusate 200 mg p.o. nightly along with attempting grams of MiraLAX daily PRN  Hypokalemia -Patient potassium of morning was 3.2 -Replete with p.o. potassium chloride 40 mg twice daily x2 doses prior to discharge -Continue to Monitor and Replete as Necessary -Repeat CMP in the outpatient setting  Hyponatremia -Patient's sodium this morning was 130 -Resumed IV fluids currently normal saline until discharge; also given a bolus of 500 mL normal saline this morning -Continue monitor trend and repeat CMP in the outpatient setting PCP  Leukopenia -Mild as patient's WBC went from 4.7 now 3.7 -Continue monitor and follow-up in outpatient setting and repeat CBC at follow-up visit  Normocytic Anemia -Patient's hemoglobin/hematocrit went from 14.1/43.4 and is now 12.9/39.3 -Likely Dilutional drop in the setting of IV fluid -Check anemia panel outpatient -Continue to monitor for signs and symptoms of bleeding; Currently no overt bleeding noted -Follow-up with PCP for further work-up  Discharge Instructions Discharge Instructions    Call MD for:  difficulty breathing, headache or visual disturbances   Complete by: As directed    Call MD for:  extreme fatigue   Complete by: As directed    Call MD for:  hives   Complete by: As directed    Call MD for:  persistant dizziness or  light-headedness   Complete by: As directed    Call MD for:  persistant nausea and vomiting   Complete by: As directed    Call MD for:  redness, tenderness, or signs of infection (pain, swelling, redness, odor or green/yellow discharge around incision site)   Complete by: As directed    Call MD for:  severe uncontrolled pain   Complete by: As directed    Call MD for:  temperature >100.4   Complete by: As directed    Diet - low sodium heart healthy   Complete by: As directed    Diet Carb Modified   Complete by: As directed    Discharge instructions   Complete by: As directed    You were cared for by a hospitalist during your hospital stay. If you have any questions about your discharge medications or the care you received while you were in the hospital after you are discharged, you can call the unit and ask to speak with the hospitalist on call if the hospitalist that took care of you is not available. Once you are discharged, your primary care physician will handle any further medical issues. Please note that NO REFILLS for any discharge medications will be authorized once you are discharged, as it is imperative that you return to your primary care physician (or establish a relationship with a primary care physician if you do not have one) for your aftercare needs so that they can reassess your need for medications and monitor  your lab values.  Follow up with PCP and Outpatient Diabetes Coordniator. Take all medications as prescribed. If symptoms change or worsen please return to the ED for evaluation   Increase activity slowly   Complete by: As directed      Allergies as of 12/27/2018   No Known Allergies     Medication List    STOP taking these medications   HYDROcodone-acetaminophen 5-325 MG tablet Commonly known as: NORCO/VICODIN   sulfamethoxazole-trimethoprim 800-160 MG tablet Commonly known as: BACTRIM DS     TAKE these medications   atorvastatin 40 MG tablet Commonly  known as: LIPITOR Take 1 tablet (40 mg total) by mouth daily at 6 PM.   BLACK ELDERBERRY PO Take 1 each by mouth daily.   blood glucose meter kit and supplies Kit Dispense based on patient and insurance preference. Use up to four times daily as directed. (FOR ICD-9 250.00, 250.01).   Fish Oil 1000 MG Caps Take 1,000 mg by mouth daily.   GARLIC PO Take 1 capsule by mouth once a week.   insulin aspart 100 UNIT/ML FlexPen Commonly known as: NOVOLOG Inject 10 Units into the skin 3 (three) times daily with meals.   insulin glargine 100 unit/mL Sopn Commonly known as: LANTUS Inject 0.28 mLs (28 Units total) into the skin daily.   Insulin Pen Needle 31G X 5 MM Misc 1 Container by Does not apply route 4 (four) times daily.   Multivitamin Men Tabs Take 1 tablet by mouth daily.      Follow-up Information    Schedule an appointment as soon as possible for a visit with Placey, Audrea Muscat, NP.   Contact information: [place] [address] [phone]        [facility]. Go on 01/07/2019.   Why: 2:30 PM go directly there to the Pharmacy @ d/c to get 1 time fill for meds. Contact information: [place] [address] [phone]         No Known Allergies  Consultations:  Diabetes Coordinator  Procedures/Studies: Ct Angio Chest Pe W And/or Wo Contrast  Result Date: 12/25/2018 CLINICAL DATA:  Shortness of breath and fatigue 2 days. Occasional cough. EXAM: CT ANGIOGRAPHY CHEST WITH CONTRAST TECHNIQUE: Multidetector CT imaging of the chest was performed using the standard protocol during bolus administration of intravenous contrast. Multiplanar CT image reconstructions and MIPs were obtained to evaluate the vascular anatomy. CONTRAST:  157m OMNIPAQUE IOHEXOL 350 MG/ML SOLN COMPARISON:  Thoracic/lumbar spine CT 05/28/2009 and abdominal CT 05/04/2009 FINDINGS: Cardiovascular: Heart is normal size.  Thoracic aorta is normal caliber. Pulmonary arterial system is well opacified and otherwise normal. Remaining vascular structures are unremarkable. Mediastinum/Nodes: No mediastinal or hilar adenopathy. Remaining mediastinal structures are normal. Mild prominence of the inferior left lobe of the thyroid. No axillary adenopathy. Lungs/Pleura: The lungs are well inflated without consolidation or effusion. There are several small bilateral subcentimeter peripheral nodules unchanged from 2010 and therefore benign. Airways are normal. Upper Abdomen: No acute findings. Musculoskeletal: Minimal degenerative change of the spine. Review of the MIP images confirms the above findings. IMPRESSION: No acute cardiopulmonary disease and no evidence of pulmonary embolism. Electronically Signed   By: DMarin OlpM.D.   On: 12/25/2018 08:43    Subjective: Seen and examined at bedside was doing well.  Denies chest pain, lightheadedness or dizziness.  No nausea or vomiting.  No other concerns or complaints at this time is ready to go  home.  Discharge Exam: Vitals:   12/26/18 2205 12/27/18 0607  BP: (!) 138/92 120/79  Pulse: 66 65  Resp: 18 20  Temp: 98.2 F (36.8 C) 98.7 F (37.1 C)  SpO2: 100% 100%   Vitals:   12/26/18 0836 12/26/18 1216 12/26/18 2205 12/27/18 0607  BP: (!) 133/96 105/66 (!) 138/92 120/79  Pulse: 76 75 66 65  Resp: '18 16 18 20  ' Temp: 98.2 F (36.8 C) 98 F (36.7 C) 98.2 F (36.8 C) 98.7 F (37.1 C)  TempSrc: Oral Oral Oral Oral  SpO2: 100% 99% 100% 100%  Weight:      Height:       General: Pt is alert, awake, not in acute distress Cardiovascular: RRR, S1/S2 +, no rubs, no gallops Respiratory: CTA bilaterally, no wheezing, no rhonchi Abdominal: Soft, NT, Distended 2/2 body habitus, bowel sounds + Extremities: no edema, no cyanosis  The results of significant diagnostics from this hospitalization (including imaging, microbiology, ancillary and laboratory) are listed below for  reference.    Microbiology: Recent Results (from the past 240 hour(s))  Novel Coronavirus,NAA,(SEND-OUT TO REF LAB - TAT 24-48 hrs); Hosp Order     Status: None   Collection Time: 12/25/18  6:15 AM   Specimen: Nasopharyngeal Swab; Respiratory  Result Value Ref Range Status   SARS-CoV-2, NAA NOT DETECTED NOT DETECTED Final    Comment: (NOTE) This test was developed and its performance characteristics determined by [employer]. This test has not been FDA cleared or approved. This test has been authorized by FDA under an Emergency Use Authorization (EUA). This test is only authorized for the duration of time the declaration that circumstances exist justifying the authorization of the emergency use of in vitro diagnostic tests for detection of SARS-CoV-2 virus and/or diagnosis of COVID-19 infection under section 564(b)(1) of the Act, 21 U.S.C. 147WLK-9(V)(7), unless the authorization is terminated or revoked sooner. When diagnostic testing is negative, the possibility of a false negative result should be considered in the context of a patient's recent exposures and the presence of clinical signs and symptoms consistent with COVID-19. An individual without symptoms of COVID-19 and who is not shedding SARS-CoV-2 virus would expect to have a negative (not detected) result in this assay. Performed  At: [facility] [place], [place] [id number] Rush Farmer MD UK:[id number]    [place]  Final    Comment: Performed at [facility] [address], [place], [place] [address]  MRSA PCR Screening     Status: None   Collection Time: 12/25/18 12:04 PM   Specimen: Nasal Mucosa; Nasopharyngeal  Result Value Ref Range Status   MRSA by PCR NEGATIVE NEGATIVE Final    Comment:        The GeneXpert MRSA Assay (FDA approved for NASAL specimens only), is one component of a comprehensive MRSA colonization surveillance  program. It is not intended to diagnose MRSA infection nor to guide or monitor treatment for MRSA infections. Performed at [facility], [place] [address] [place], [place] [address]   Novel Coronavirus,NAA,(SEND-OUT TO REF LAB - TAT 24-48 hrs); Hosp Order     Status: None   Collection Time: 12/25/18 11:35 PM   Specimen: Nasopharyngeal Swab; Respiratory  Result Value Ref Range Status   SARS-CoV-2, NAA NOT DETECTED NOT DETECTED Final    Comment: (NOTE) This test was developed and its performance characteristics determined by [employer]. This test has not been FDA cleared or approved. This test  has been authorized by FDA under an Emergency Use Authorization (EUA). This test is only authorized for the duration of time the declaration that circumstances exist justifying the authorization of the emergency use of in vitro diagnostic tests for detection of SARS-CoV-2 virus and/or diagnosis of COVID-19 infection under section 564(b)(1) of the Act, 21 U.S.C. 818HUD-1(S)(9), unless the authorization is terminated or revoked sooner. When diagnostic testing is negative, the possibility of a false negative result should be considered in the context of a patient's recent exposures and the presence of clinical signs and symptoms consistent with COVID-19. An individual without symptoms of COVID-19 and who is not shedding SARS-CoV-2 virus would expect to have a negative (not detected) result in this assay. Performed  At: [facility] [place], [place] [id number] Rush Farmer MD IF:[id number]    [place]  Final    Comment: Performed at [facility] [address], [place], [place] [address]    Labs: BNP (last 3 results) No results for input(s): BNP in the last 8760 hours. Basic Metabolic Panel: Recent Labs  Lab 12/25/18 1416 12/25/18 1754 12/26/18 0248 12/26/18 0920 12/27/18 0359  NA 140 139  138 136 130*  K 4.1 3.8 3.7 3.9 3.2*  CL 104 107 106 105 101  CO2 20* 21* 21* 19* 21*  GLUCOSE 296* 151* 217* 292* 262*  BUN '13 11 11 12 11  ' CREATININE 1.00 0.94 0.97 0.99 0.82  CALCIUM 9.2 8.9 8.6* 8.7* 7.9*  MG  --   --   --  2.1 2.0  PHOS  --   --   --  2.3* 3.7   Liver Function Tests: Recent Labs  Lab 12/26/18 0920 12/27/18 0359  AST 20 17  ALT 20 16  ALKPHOS 70 61  BILITOT 1.7* 1.0  PROT 6.8 5.8*  ALBUMIN 3.6 3.0*   No results for input(s): LIPASE, AMYLASE in the last 168 hours. No results for input(s): AMMONIA in the last 168 hours. CBC: Recent Labs  Lab 12/25/18 0615 12/26/18 0920 12/27/18 0359  WBC 5.8 4.7 3.7*  NEUTROABS 2.3 2.6 1.3*  HGB 16.6 14.1 12.9*  HCT 50.0 43.4 39.3  MCV 90.3 94.1 92.3  PLT 248 187 174   Cardiac Enzymes: Recent Labs  Lab 12/25/18 0615  TROPONINI <0.03   BNP: Invalid input(s): POCBNP CBG: Recent Labs  Lab 12/26/18 1214 12/26/18 1620 12/26/18 2201 12/27/18 0731 12/27/18 1206  GLUCAP 352* 276* 153* 262* 266*   D-Dimer No results for input(s): DDIMER in the last 72 hours. Hgb A1c No results for input(s): HGBA1C in the last 72 hours. Lipid Profile Recent Labs    12/26/18 0248  CHOL 263*  HDL 43  LDLCALC 195*  TRIG 127  CHOLHDL 6.1   Thyroid function studies Recent Labs    12/27/18 0359  TSH 1.426   Anemia work up No results for input(s): VITAMINB12, FOLATE, FERRITIN, TIBC, IRON, RETICCTPCT in the last 72 hours. Urinalysis    Component Value Date/Time   COLORURINE AMBER (A) 12/13/2016 1139   APPEARANCEUR HAZY (A) 12/13/2016 1139   LABSPEC 1.016 12/13/2016 1139   PHURINE 5.0 12/13/2016 1139   GLUCOSEU NEGATIVE 12/13/2016 1139   HGBUR LARGE (A) 12/13/2016 1139   BILIRUBINUR NEGATIVE 12/13/2016 1139   KETONESUR NEGATIVE 12/13/2016 1139   PROTEINUR 100 (A) 12/13/2016 1139   UROBILINOGEN 2.0 (H) 05/04/2009 1507   NITRITE NEGATIVE 12/13/2016 1139   LEUKOCYTESUR NEGATIVE 12/13/2016 1139   Sepsis  Labs Invalid input(s):  PROCALCITONIN,  WBC,  LACTICIDVEN Microbiology Recent Results (from the past 240 hour(s))  Novel Coronavirus,NAA,(SEND-OUT TO REF LAB - TAT 24-48 hrs); Hosp Order     Status: None   Collection Time: 12/25/18  6:15 AM   Specimen: Nasopharyngeal Swab; Respiratory  Result Value Ref Range Status   SARS-CoV-2, NAA NOT DETECTED NOT DETECTED Final    Comment: (NOTE) This test was developed and its performance characteristics determined by [employer]. This test has not been FDA cleared or approved. This test has been authorized by FDA under an Emergency Use Authorization (EUA). This test is only authorized for the duration of time the declaration that circumstances exist justifying the authorization of the emergency use of in vitro diagnostic tests for detection of SARS-CoV-2 virus and/or diagnosis of COVID-19 infection under section 564(b)(1) of the Act, 21 U.S.C. 458KDX-8(P)(3), unless the authorization is terminated or revoked sooner. When diagnostic testing is negative, the possibility of a false negative result should be considered in the context of a patient's recent exposures and the presence of clinical signs and symptoms consistent with COVID-19. An individual without symptoms of COVID-19 and who is not shedding SARS-CoV-2 virus would expect to have a negative (not detected) result in this assay. Performed  At: [facility] [place], [place] [id number] Rush Farmer MD BH:[id number]    [place]  Final    Comment: Performed at [facility] [address] [place], [place] [address]  MRSA PCR Screening     Status: None   Collection Time: 12/25/18 12:04 PM   Specimen: Nasal Mucosa; Nasopharyngeal  Result Value Ref Range Status   MRSA by PCR NEGATIVE NEGATIVE Final    Comment:        The GeneXpert MRSA Assay (FDA approved for NASAL specimens only), is one component of  a comprehensive MRSA colonization surveillance program. It is not intended to diagnose MRSA infection nor to guide or monitor treatment for MRSA infections. Performed at [facility], [place] [address], [place], [place] [address]   Novel Coronavirus,NAA,(SEND-OUT TO REF LAB - TAT 24-48 hrs); Hosp Order     Status: None   Collection Time: 12/25/18 11:35 PM   Specimen: Nasopharyngeal Swab; Respiratory  Result Value Ref Range Status   SARS-CoV-2, NAA NOT DETECTED NOT DETECTED Final    Comment: (NOTE) This test was developed and its performance characteristics determined by [employer]. This test has not been FDA cleared or approved. This test has been authorized by FDA under an Emergency Use Authorization (EUA). This test is only authorized for the duration of time the declaration that circumstances exist justifying the authorization of the emergency use of in vitro diagnostic tests for detection of SARS-CoV-2 virus and/or diagnosis of COVID-19 infection under section 564(b)(1) of the Act, 21 U.S.C. 268TMH-9(Q)(2), unless the authorization is terminated or revoked sooner. When diagnostic testing is negative, the possibility of a false negative result should be considered in the context of a patient's recent exposures and the presence of clinical signs and symptoms consistent with COVID-19. An individual without symptoms of COVID-19 and who is not shedding SARS-CoV-2 virus would expect to have a negative (not detected) result in this assay. Performed  At: [facility] [place], [place] [id number] Rush Farmer MD JH:[id number]    [place]  Final    Comment: Performed at [facility] [address], [place], [place] [address]   Time coordinating discharge: 35 minutes  SIGNED:  Kerney Elbe, DO [facility] 12/28/2018, 7:35 PM Pager is on [place]  If 7PM-7AM, please contact  night-coverage [email or web address] Password [email or web address]

## 2018-12-27 NOTE — TOC Transition Note (Signed)
Transition of Care [facility]) - CM/SW Discharge Note   Patient Details  Name: Ronnald Shedden MRN: [id number] Date of Birth: [birth year]  [facility] CM/SW Contact:  Dessa Phi, RN Phone Number: 12/27/2018, 11:49 AM   Clinical Narrative: Spoke to patient on phone about d/c plans-patient agrees. [facility] pharmacy spoke to [place] about meds available for 1x fill-they will provide lantus/novolog pen,glucose meter,lancets,strips-he must get there by 3:30p.Reminded patient about the importance of pcp appt,& establishing health insurance @ [facility]-he voiced understanding. Patient has own transport home.MD updated & agree to d/c plan.  No further CM needs.      Final next level of care: Home/Self Care Barriers to Discharge: No Barriers Identified   Patient Goals and CMS Choice        Discharge Placement                       Discharge Plan and Services                                     Social Determinants of Health (SDOH) Interventions     Readmission Risk Interventions No flowsheet data found.

## 2018-12-29 ENCOUNTER — Emergency Department (HOSPITAL_COMMUNITY)
Admission: EM | Admit: 2018-12-29 | Discharge: 2018-12-29 | Disposition: A | Discharge status: Home / Self Care | Payer: Self-pay | Attending: Emergency Medicine | Admitting: Emergency Medicine

## 2018-12-29 ENCOUNTER — Encounter (HOSPITAL_COMMUNITY): Payer: Self-pay | Admitting: Emergency Medicine

## 2018-12-29 ENCOUNTER — Emergency Department (HOSPITAL_COMMUNITY): Payer: Self-pay

## 2018-12-29 ENCOUNTER — Other Ambulatory Visit: Payer: Self-pay

## 2018-12-29 DIAGNOSIS — R1013 Epigastric pain: Secondary | ICD-10-CM

## 2018-12-29 DIAGNOSIS — Z79899 Other long term (current) drug therapy: Secondary | ICD-10-CM | POA: Insufficient documentation

## 2018-12-29 DIAGNOSIS — E119 Type 2 diabetes mellitus without complications: Secondary | ICD-10-CM | POA: Insufficient documentation

## 2018-12-29 DIAGNOSIS — R109 Unspecified abdominal pain: Secondary | ICD-10-CM | POA: Insufficient documentation

## 2018-12-29 DIAGNOSIS — Z794 Long term (current) use of insulin: Secondary | ICD-10-CM | POA: Insufficient documentation

## 2018-12-29 HISTORY — DX: Type 2 diabetes mellitus without complications: E11.9

## 2018-12-29 LAB — URINALYSIS, ROUTINE W REFLEX MICROSCOPIC
Bacteria, UA: NONE SEEN
Bilirubin Urine: NEGATIVE
Glucose, UA: >=500 mg/dL — AB
Hgb urine dipstick: NEGATIVE
Ketones, ur: 20 mg/dL — AB
Leukocytes,Ua: NEGATIVE
Nitrite: NEGATIVE
Protein, ur: NEGATIVE mg/dL
Specific Gravity, Urine: 1.011 (ref 1.005–1.030)
pH: 5 (ref 5.0–8.0)

## 2018-12-29 LAB — LIPASE, BLOOD: Lipase: 58 U/L — ABNORMAL HIGH (ref 11–51)

## 2018-12-29 LAB — COMPREHENSIVE METABOLIC PANEL
ALT: 79 U/L — ABNORMAL HIGH (ref 0–44)
AST: 117 U/L — ABNORMAL HIGH (ref 15–41)
Albumin: 3.9 g/dL (ref 3.5–5.0)
Alkaline Phosphatase: 81 U/L (ref 38–126)
Anion gap: 11 (ref 5–15)
BUN: 9 mg/dL (ref 6–20)
CO2: 22 mmol/L (ref 22–32)
Calcium: 9.2 mg/dL (ref 8.9–10.3)
Chloride: 105 mmol/L (ref 98–111)
Creatinine, Ser: 0.82 mg/dL (ref 0.61–1.24)
GFR calc Af Amer: >60 mL/min (ref >60)
GFR calc non Af Amer: >60 mL/min (ref >60)
Glucose, Bld: 294 mg/dL — ABNORMAL HIGH (ref 70–99)
Potassium: 3.6 mmol/L (ref 3.5–5.1)
Sodium: 138 mmol/L (ref 135–145)
Total Bilirubin: 0.7 mg/dL (ref 0.3–1.2)
Total Protein: 7.2 g/dL (ref 6.5–8.1)

## 2018-12-29 LAB — BLOOD GAS, VENOUS
Acid-base deficit: 1.1 mmol/L (ref 0.0–2.0)
Bicarbonate: 22.3 mmol/L (ref 20.0–28.0)
O2 Saturation: 59 %
Patient temperature: 98.6
pCO2, Ven: 34.7 mmHg — ABNORMAL LOW (ref 44.0–60.0)
pH, Ven: 7.423 (ref 7.250–7.430)

## 2018-12-29 LAB — CBC WITH DIFFERENTIAL/PLATELET
Abs Immature Granulocytes: 0.02 10*3/uL (ref 0.00–0.07)
Basophils Absolute: 0 10*3/uL (ref 0.0–0.1)
Basophils Relative: 0 %
Eosinophils Absolute: 0.1 10*3/uL (ref 0.0–0.5)
Eosinophils Relative: 1 %
HCT: 41.5 % (ref 39.0–52.0)
Hemoglobin: 13.9 g/dL (ref 13.0–17.0)
Immature Granulocytes: 0 %
Lymphocytes Relative: 29 %
Lymphs Abs: 1.5 10*3/uL (ref 0.7–4.0)
MCH: 30.8 pg (ref 26.0–34.0)
MCHC: 33.5 g/dL (ref 30.0–36.0)
MCV: 92 fL (ref 80.0–100.0)
Monocytes Absolute: 0.5 10*3/uL (ref 0.1–1.0)
Monocytes Relative: 9 %
Neutro Abs: 3.1 10*3/uL (ref 1.7–7.7)
Neutrophils Relative %: 61 %
Platelets: 188 10*3/uL (ref 150–400)
RBC: 4.51 MIL/uL (ref 4.22–5.81)
RDW: 13.2 % (ref 11.5–15.5)
WBC: 5.2 10*3/uL (ref 4.0–10.5)
nRBC: 0 % (ref 0.0–0.2)

## 2018-12-29 MED ORDER — SODIUM CHLORIDE 0.9 % IV SOLN
INTRAVENOUS | Status: DC
  Administered 2018-12-29: 08:00:00 via INTRAVENOUS

## 2018-12-29 MED ORDER — ONDANSETRON 4 MG PO TBDP: 4.0000 mg | ORAL_TABLET | Freq: Three times a day (TID) | ORAL | 0 refills | Status: DC | PRN

## 2018-12-29 MED ORDER — MORPHINE SULFATE (PF) 4 MG/ML IV SOLN
4.0000 mg | Freq: Once | INTRAVENOUS | Status: AC
  Administered 2018-12-29: 4 mg via INTRAVENOUS
  Filled 2018-12-29: qty 1

## 2018-12-29 MED ORDER — ONDANSETRON HCL 4 MG/2ML IJ SOLN
4.0000 mg | Freq: Once | INTRAMUSCULAR | Status: AC
  Administered 2018-12-29: 08:00:00 4 mg via INTRAVENOUS
  Filled 2018-12-29: qty 2

## 2018-12-29 NOTE — ED Triage Notes (Signed)
Pt reports RUQ pains that started earlier this morning. Denies n/v/d or urinary problems. Reports was discharged from here on Friday with diabetes.
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
## 2018-12-29 NOTE — Discharge Instructions (Signed)
As discussed, it is important that you monitor your condition carefully, stay well-hydrated and be sure to follow-up with your physician as scheduled in 1 week. Return here for concerning changes.

## 2019-01-07 ENCOUNTER — Other Ambulatory Visit: Payer: Self-pay

## 2019-01-07 ENCOUNTER — Ambulatory Visit: Payer: Self-pay | Attending: Internal Medicine

## 2019-01-07 ENCOUNTER — Ambulatory Visit: Payer: Self-pay | Attending: Internal Medicine | Admitting: Internal Medicine

## 2019-01-07 ENCOUNTER — Telehealth: Payer: Self-pay | Admitting: Internal Medicine

## 2019-01-07 ENCOUNTER — Encounter: Payer: Self-pay | Admitting: Internal Medicine

## 2019-01-07 VITALS — BP 110/72 | HR 80 | Temp 98.5°F | Resp 18 | Ht 71.0 in | Wt 265.0 lb

## 2019-01-07 DIAGNOSIS — IMO0001 Reserved for inherently not codable concepts without codable children: Secondary | ICD-10-CM

## 2019-01-07 DIAGNOSIS — E669 Obesity, unspecified: Secondary | ICD-10-CM

## 2019-01-07 DIAGNOSIS — E1165 Type 2 diabetes mellitus with hyperglycemia: Secondary | ICD-10-CM

## 2019-01-07 DIAGNOSIS — E785 Hyperlipidemia, unspecified: Secondary | ICD-10-CM

## 2019-01-07 DIAGNOSIS — Z794 Long term (current) use of insulin: Secondary | ICD-10-CM

## 2019-01-07 DIAGNOSIS — E1169 Type 2 diabetes mellitus with other specified complication: Secondary | ICD-10-CM

## 2019-01-07 MED ORDER — INSULIN GLARGINE 100 UNITS/ML SOLOSTAR PEN: 28.0000 [IU] | PEN_INJECTOR | Freq: Every day | SUBCUTANEOUS | 11 refills | Status: DC

## 2019-01-07 MED ORDER — ATORVASTATIN CALCIUM 40 MG PO TABS: 40.0000 mg | ORAL_TABLET | Freq: Every day | ORAL | 6 refills | Status: DC

## 2019-01-07 MED ORDER — INSULIN ASPART 100 UNIT/ML FLEXPEN: 3.0000 [IU] | PEN_INJECTOR | Freq: Three times a day (TID) | SUBCUTANEOUS | 11 refills | Status: DC

## 2019-01-07 MED FILL — ?ATORVASTATIN 40MG TABLET: 40 | 30 days supply | Qty: 30 | Fill #0
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
## 2019-01-07 NOTE — Telephone Encounter (Signed)
Pt signed authorization to Dr My Marin Comment at [facility] ph [phone] to request diabetic eye exam results. Faxed to [phone] per pt request.

## 2019-01-07 NOTE — Patient Instructions (Signed)
Please give patient an appointment with the clinical pharmacist in 2 weeks for follow-up on diabetes.  Take your Lantus insulin in the evenings after dinner. Decrease the NovoLog to 3 units with meals.   Diabetes Mellitus and Standards of Medical Care Managing diabetes (diabetes mellitus) can be complicated. Your diabetes treatment may be managed by a team of health care providers, including:  A physician who specializes in diabetes (endocrinologist).  A nurse practitioner or physician assistant.  Nurses.  A diet and nutrition specialist (registered dietitian).  A certified diabetes educator (CDE).  An exercise specialist.  A pharmacist.  An eye doctor.  A foot specialist (podiatrist).  A dentist.  A primary care provider.  A mental health provider. Your health care providers follow guidelines to help you get the best quality of care. The following schedule is a general guideline for your diabetes management plan. Your health care providers may give you more specific instructions. Physical exams Upon being diagnosed with diabetes mellitus, and each year after that, your health care provider will ask about your medical and family history. He or she will also do a physical exam. Your exam may include:  Measuring your height, weight, and body mass index (BMI).  Checking your blood pressure. This will be done at every routine medical visit. Your target blood pressure may vary depending on your medical conditions, your age, and other factors.  Thyroid gland exam.  Skin exam.  Screening for damage to your nerves (peripheral neuropathy). This may include checking the pulse in your legs and feet and checking the level of sensation in your hands and feet.  A complete foot exam to inspect the structure and skin of your feet, including checking for cuts, bruises, redness, blisters, sores, or other problems.  Screening for blood vessel (vascular) problems, which may include  checking the pulse in your legs and feet and checking your temperature. Blood tests Depending on your treatment plan and your personal needs, you may have the following tests done:  HbA1c (hemoglobin A1c). This test provides information about blood sugar (glucose) control over the previous 2-3 months. It is used to adjust your treatment plan, if needed. This test will be done: ? At least 2 times a year, if you are meeting your treatment goals. ? 4 times a year, if you are not meeting your treatment goals or if treatment goals have changed.  Lipid testing, including total, LDL, and HDL cholesterol and triglyceride levels. ? The goal for LDL is less than 100 mg/dL (5.5 mmol/L). If you are at high risk for complications, the goal is less than 70 mg/dL (3.9 mmol/L). ? The goal for HDL is 40 mg/dL (2.2 mmol/L) or higher for men and 50 mg/dL (2.8 mmol/L) or higher for women. An HDL cholesterol of 60 mg/dL (3.3 mmol/L) or higher gives some protection against heart disease. ? The goal for triglycerides is less than 150 mg/dL (8.3 mmol/L).  Liver function tests.  Kidney function tests.  Thyroid function tests. Dental and eye exams  Visit your dentist two times a year.  If you have type 1 diabetes, your health care provider may recommend an eye exam 3-5 years after you are diagnosed, and then once a year after your first exam. ? For children with type 1 diabetes, a health care provider may recommend an eye exam when your child is age 31 or older and has had diabetes for 3-5 years. After the first exam, your child should get an eye exam once a  year.  If you have type 2 diabetes, your health care provider may recommend an eye exam as soon as you are diagnosed, and then once a year after your first exam. Immunizations   The yearly flu (influenza) vaccine is recommended for everyone 6 months or older who has diabetes.  The pneumonia (pneumococcal) vaccine is recommended for everyone 2 years or older  who has diabetes. If you are 21 or older, you may get the pneumonia vaccine as a series of two separate shots.  The hepatitis B vaccine is recommended for adults shortly after being diagnosed with diabetes.  Adults and children with diabetes should receive all other vaccines according to age-specific recommendations from the Centers for Disease Control and Prevention (CDC). Mental and emotional health Screening for symptoms of eating disorders, anxiety, and depression is recommended at the time of diagnosis and afterward as needed. If your screening shows that you have symptoms (positive screening result), you may need more evaluation and you may work with a mental health care provider. Treatment plan Your treatment plan will be reviewed at every medical visit. You and your health care provider will discuss:  How you are taking your medicines, including insulin.  Any side effects you are experiencing.  Your blood glucose target goals.  The frequency of your blood glucose monitoring.  Lifestyle habits, such as activity level as well as tobacco, alcohol, and substance use. Diabetes self-management education Your health care provider will assess how well you are monitoring your blood glucose levels and whether you are taking your insulin correctly. He or she may refer you to:  A certified diabetes educator to manage your diabetes throughout your life, starting at diagnosis.  A registered dietitian who can create or review your personal nutrition plan.  An exercise specialist who can discuss your activity level and exercise plan. Summary  Managing diabetes (diabetes mellitus) can be complicated. Your diabetes treatment may be managed by a team of health care providers.  Your health care providers follow guidelines in order to help you get the best quality of care.  Standards of care including having regular physical exams, blood tests, blood pressure monitoring, immunizations, screening  tests, and education about how to manage your diabetes.  Your health care providers may also give you more specific instructions based on your individual health. This information is not intended to replace advice given to you by your health care provider. Make sure you discuss any questions you have with your health care provider. Document Released: 04/23/2009 Document Revised: 03/15/2018 Document Reviewed: 03/24/2016 Elsevier Patient Education  2020 [employer].   Hypoglycemia Hypoglycemia is when the sugar (glucose) level in your blood is too low. Signs of low blood sugar may include:  Feeling: ? Hungry. ? Worried or nervous (anxious). ? Sweaty and clammy. ? Confused. ? Dizzy. ? Sleepy. ? Sick to your stomach (nauseous).  Having: ? A fast heartbeat. ? A headache. ? A change in your vision. ? Tingling or no feeling (numbness) around your mouth, lips, or tongue. ? Jerky movements that you cannot control (seizure).  Having trouble with: ? Moving (coordination). ? Sleeping. ? Passing out (fainting). ? Getting upset easily (irritability). Low blood sugar can happen to people who have diabetes and people who do not have diabetes. Low blood sugar can happen quickly, and it can be an emergency. Treating low blood sugar Low blood sugar is often treated by eating or drinking something sugary right away, such as:  Fruit juice, 4-6 oz (120-150 mL).  Regular soda (not diet soda), 4-6 oz (120-150 mL).  Low-fat milk, 4 oz (120 mL).  Several pieces of hard candy.  Sugar or honey, 1 Tbsp (15 mL). Treating low blood sugar if you have diabetes If you can think clearly and swallow safely, follow the 15:15 rule:  Take 15 grams of a fast-acting carb (carbohydrate). Talk with your doctor about how much you should take.  Always keep a source of fast-acting carb with you, such as: ? Sugar tablets (glucose pills). Take 3-4 pills. ? 6-8 pieces of hard candy. ? 4-6 oz (120-150 mL) of  fruit juice. ? 4-6 oz (120-150 mL) of regular (not diet) soda. ? 1 Tbsp (15 mL) honey or sugar.  Check your blood sugar 15 minutes after you take the carb.  If your blood sugar is still at or below 70 mg/dL (3.9 mmol/L), take 15 grams of a carb again.  If your blood sugar does not go above 70 mg/dL (3.9 mmol/L) after 3 tries, get help right away.  After your blood sugar goes back to normal, eat a meal or a snack within 1 hour.  Treating very low blood sugar If your blood sugar is at or below 54 mg/dL (3 mmol/L), you have very low blood sugar (severe hypoglycemia). This may also cause:  Passing out.  Jerky movements you cannot control (seizure).  Losing consciousness (coma). This is an emergency. Do not wait to see if the symptoms will go away. Get medical help right away. Call your local emergency services (911 in the [place] Do not drive yourself to the hospital. If you have very low blood sugar and you cannot eat or drink, you may need a glucagon shot (injection). A family member or friend should learn how to check your blood sugar and how to give you a glucagon shot. Ask your doctor if you need to have a glucagon shot kit at home. Follow these instructions at home: General instructions  Take over-the-counter and prescription medicines only as told by your doctor.  Stay aware of your blood sugar as told by your doctor.  Limit alcohol intake to no more than 1 drink a day for nonpregnant women and 2 drinks a day for men. One drink equals 12 oz of beer (355 mL), 5 oz of wine (148 mL), or 1 oz of hard liquor (44 mL).  Keep all follow-up visits as told by your doctor. This is important. If you have diabetes:   Follow your diabetes care plan as told by your doctor. Make sure you: ? Know the signs of low blood sugar. ? Take your medicines as told. ? Follow your exercise and meal plan. ? Eat on time. Do not skip meals. ? Check your blood sugar as often as told by your doctor. Always  check it before and after exercise. ? Follow your sick day plan when you cannot eat or drink normally. Make this plan ahead of time with your doctor.  Share your diabetes care plan with: ? Your work or school. ? People you live with.  Check your pee (urine) for ketones: ? When you are sick. ? As told by your doctor.  Carry a card or wear jewelry that says you have diabetes. Contact a doctor if:  You have trouble keeping your blood sugar in your target range.  You have low blood sugar often. Get help right away if:  You still have symptoms after you eat or drink something sugary.  Your blood sugar is at  or below 54 mg/dL (3 mmol/L).  You have jerky movements that you cannot control.  You pass out. These symptoms may be an emergency. Do not wait to see if the symptoms will go away. Get medical help right away. Call your local emergency services (911 in the [place] Do not drive yourself to the hospital. Summary  Hypoglycemia happens when the level of sugar (glucose) in your blood is too low.  Low blood sugar can happen to people who have diabetes and people who do not have diabetes. Low blood sugar can happen quickly, and it can be an emergency.  Make sure you know the signs of low blood sugar and know how to treat it.  Always keep a source of sugar (fast-acting carb) with you to treat low blood sugar. This information is not intended to replace advice given to you by your health care provider. Make sure you discuss any questions you have with your health care provider. Document Released: 09/20/2009 Document Revised: 10/17/2018 Document Reviewed: 07/30/2015 Elsevier Patient Education  2020 [employer].

## 2019-01-08 LAB — COMPREHENSIVE METABOLIC PANEL
ALT: 40 IU/L (ref 0–44)
AST: 35 IU/L (ref 0–40)
Albumin/Globulin Ratio: 2 (ref 1.2–2.2)
Albumin: 4.5 g/dL (ref 4.0–5.0)
Alkaline Phosphatase: 68 IU/L (ref 39–117)
BUN/Creatinine Ratio: 10 (ref 9–20)
BUN: 8 mg/dL (ref 6–24)
Bilirubin Total: 0.6 mg/dL (ref 0.0–1.2)
CO2: 22 mmol/L (ref 20–29)
Calcium: 9.7 mg/dL (ref 8.7–10.2)
Chloride: 108 mmol/L — ABNORMAL HIGH (ref 96–106)
Creatinine, Ser: 0.78 mg/dL (ref 0.76–1.27)
GFR calc Af Amer: 122 mL/min/{1.73_m2} (ref >59)
GFR calc non Af Amer: 105 mL/min/{1.73_m2} (ref >59)
Globulin, Total: 2.3 g/dL (ref 1.5–4.5)
Glucose: 77 mg/dL (ref 65–99)
Potassium: 4.2 mmol/L (ref 3.5–5.2)
Sodium: 143 mmol/L (ref 134–144)
Total Protein: 6.8 g/dL (ref 6.0–8.5)

## 2019-01-08 LAB — CBC
Hematocrit: 39.4 % (ref 37.5–51.0)
Hemoglobin: 13.4 g/dL (ref 13.0–17.7)
MCH: 30.7 pg (ref 26.6–33.0)
MCHC: 34 g/dL (ref 31.5–35.7)
MCV: 90 fL (ref 79–97)
Platelets: 321 10*3/uL (ref 150–450)
RBC: 4.36 x10E6/uL (ref 4.14–5.80)
RDW: 13.8 % (ref 11.6–15.4)
WBC: 5.3 10*3/uL (ref 3.4–10.8)

## 2019-01-08 LAB — MAGNESIUM: Magnesium: 2 mg/dL (ref 1.6–2.3)

## 2019-01-08 LAB — PHOSPHORUS: Phosphorus: 3.8 mg/dL (ref 2.8–4.1)

## 2019-01-09 LAB — GLUCOSE, POCT (MANUAL RESULT ENTRY): POC Glucose: 158 mg/dl — AB (ref 70–99)

## 2019-01-09 NOTE — Telephone Encounter (Signed)
Patient verified DOB Patient is aware of labs being normal. No further questions. 

## 2019-01-21 ENCOUNTER — Ambulatory Visit: Payer: Self-pay | Attending: Internal Medicine | Admitting: Pharmacist

## 2019-01-21 ENCOUNTER — Other Ambulatory Visit: Payer: Self-pay

## 2019-01-21 DIAGNOSIS — E1165 Type 2 diabetes mellitus with hyperglycemia: Secondary | ICD-10-CM

## 2019-01-21 DIAGNOSIS — Z794 Long term (current) use of insulin: Secondary | ICD-10-CM

## 2019-01-21 DIAGNOSIS — IMO0001 Reserved for inherently not codable concepts without codable children: Secondary | ICD-10-CM

## 2019-01-21 LAB — GLUCOSE, POCT (MANUAL RESULT ENTRY): POC Glucose: 88 mg/dl (ref 70–99)

## 2019-01-21 MED ORDER — TRUE METRIX BLOOD GLUCOSE TEST VI STRP: ORAL_STRIP | 12 refills | Status: DC

## 2019-01-21 MED ORDER — BASAGLAR KWIKPEN 100 UNIT/ML ~~LOC~~ SOPN: 10.0000 [IU] | PEN_INJECTOR | Freq: Every day | SUBCUTANEOUS | 2 refills | Status: AC

## 2019-01-21 MED ORDER — TRUEPLUS LANCETS 28G MISC: 11 refills | Status: DC

## 2019-01-21 MED FILL — ?BASAGLAR 100 UNITS/ML KWPE: 100 | 30 days supply | Qty: 3 | Fill #0

## 2019-01-21 MED FILL — TRUEplus LANCETS 28G MISC: 30 days supply | Qty: 100 | Fill #0

## 2019-01-21 MED FILL — TRUE METRIX TEST STRIP: 30 days supply | Qty: 100 | Fill #0

## 2019-01-21 NOTE — Progress Notes (Signed)
    S:    PCP: Dr. Wynetta Emery No chief complaint on file.  Patient arrives in good spirits.  Presents for diabetes evaluation, education, and management.  Patient was referred and last seen by Primary Care Provider on 01/07/19. Dr. Wynetta Emery advised patient to use 28 units of Lantus in the evenings and 3 units of Novolog TID w/ meals.   HPI:  Hospitalized 6/17-20/2020 in DKA and symptoms consistent with elevated blood sugars.  A1C was 14.9.  Patient was treated appropriately with insulin drip and then transition to Lantus 28 units and NovoLog 10 units with meals.   Patient reports diabetes was diagnosed in in June during hospital stay.   Family/Social History:  - FHx: DM (mother and sister), HTN (mother, father) - Tobacco: never smoker - Alcohol: occasional use   Insurance coverage/medication affordability: self-pay  Patient denies adherence with medications.  Current diabetes medications include: Lantus 28 units at bedtime (stopped last week); Novolog 3units TID (only taking 3 units BID) Current hypertension medications include: none Current hyperlipidemia medications include: atorvastatin 40 mg daily, fish oil 1000 mg daily  Patient denies hypoglycemic events.  Patient reported dietary habits:  - Patient reports sweeping changes in his diet - He has eliminated added sugars and denies intake of sweets - Reports decrease in carbs and increase in water  Patient-reported exercise habits:  - Uses stationary bike - Is active at his place of work   Patient denies nocturia.  Patient denies neuropathy. Patient denies visual changes. Patient reports self foot exams.     O:  POCT glucose: 88 Home fasting CBG: 80s - 140s   2 hour post-prandial/random CBG: no levels >150  Lab Results  Component Value Date   HGBA1C 14.9 (H) 12/25/2018   There were no vitals filed for this visit.  Lipid Panel     Component Value Date/Time   CHOL 263 (H) 12/26/2018 0248   TRIG 127 12/26/2018 0248    HDL 43 12/26/2018 0248   CHOLHDL 6.1 12/26/2018 0248   VLDL 25 12/26/2018 0248   LDLCALC 195 (H) 12/26/2018 0248   Clinical ASCVD: No    A/P: Diabetes longstanding currently uncontrolled; however, home sugars have improved greatly. Patient is able to verbalize appropriate hypoglycemia management plan. Patient is only taking RA insulin BID before meals. I recommend to stop mealtime insulin and start 10 units of Basaglar at bedtime given drastic improvement of home glycemic control. Patient may be a candidate for oral medications if he maintains his current degree of glycemic control.   -Started Basaglar 10 units at bedtime.  -Discontinued Novolog. -Extensively discussed pathophysiology of DM, recommended lifestyle interventions, dietary effects on glycemic control -Counseled on s/sx of and management of hypoglycemia -Next A1C anticipated 03/2019.  -Due for urine microalb:SCr ratio - will obtain at next appt.   ASCVD risk - primary prevention in patient with DM. Last LDL > 190. High intensity statin indicated.  -Continued atorvastatin 40 mg.   HM: Pt due for PNA and tetanus vaccines. Will address at next appt.   Written patient instructions provided. Total time in face to face counseling 30 minutes.   Follow up Pharmacist Clinic Visit in 2 weeks.  Benard Halsted, PharmD, [place] [phone]

## 2019-01-21 NOTE — Patient Instructions (Signed)
Thank you for coming to see me today. Please do the following:  1. Stop Novolog.  2. Start taking Basaglar 10 units at bedtime.  3. Continue checking blood sugars at home.  4. Continue making the lifestyle changes we've discussed together during our visit. Diet and exercise play a significant role in improving your blood sugars.  5. Follow-up with me in 2 weeks.     Hypoglycemia or low blood sugar:   Low blood sugar can happen quickly and may become an emergency if not treated right away.   While this shouldn't happen often, it can be brought upon if you skip a meal or do not eat enough. Also, if your insulin or other diabetes medications are dosed too high, this can cause your blood sugar to go to low.   Warning signs of low blood sugar include: 1. Feeling shaky or dizzy 2. Feeling weak or tired  3. Excessive hunger 4. Feeling anxious or upset  5. Sweating even when you aren't exercising  What to do if I experience low blood sugar? 1. Check your blood sugar with your meter. If lower than 70, proceed to step 2.  2. Treat with 3-4 glucose tablets or 3 packets of regular sugar. If these aren't around, you can try hard candy. Yet another option would be to drink 4 ounces of fruit juice or 6 ounces of REGULAR soda.  3. Re-check your sugar in 15 minutes. If it is still below 70, do what you did in step 2 again. If has come back up, go ahead and eat a snack or small meal at this time.

## 2019-01-22 ENCOUNTER — Encounter: Payer: Self-pay | Admitting: Pharmacist

## 2019-02-04 ENCOUNTER — Other Ambulatory Visit: Payer: Self-pay

## 2019-02-04 ENCOUNTER — Ambulatory Visit: Payer: Self-pay | Attending: Internal Medicine | Admitting: Pharmacist

## 2019-02-04 DIAGNOSIS — IMO0001 Reserved for inherently not codable concepts without codable children: Secondary | ICD-10-CM

## 2019-02-04 DIAGNOSIS — E1165 Type 2 diabetes mellitus with hyperglycemia: Secondary | ICD-10-CM

## 2019-02-04 DIAGNOSIS — Z794 Long term (current) use of insulin: Secondary | ICD-10-CM

## 2019-02-04 LAB — GLUCOSE, POCT (MANUAL RESULT ENTRY): POC Glucose: 112 mg/dl — AB (ref 70–99)

## 2019-02-04 NOTE — Progress Notes (Signed)
    S:    PCP: Dr. Wynetta Emery No chief complaint on file.  Patient arrives in good spirits.  Presents for diabetesfollow-up.  Patient was referred and last seen by Primary Care Provider on 01/07/19. I saw him 01/21/19 and stopped his mealtime insulin. I reduced his Lantus to 10 units as pt reported near goal home CBGs without any Lantus ~4 days prior to that visit.   Family/Social History:  - FHx: DM (mother and sister), HTN (mother, father) - Tobacco: never smoker - Alcohol: occasional use   Insurance coverage/medication affordability: self-pay  Patient denies adherence with medications.  Current diabetes medications include: Lantus 10 units at bedtime (has taken none in the past 3 days) Current hypertension medications include: none Current hyperlipidemia medications include: atorvastatin 40 mg daily, fish oil 1000 mg daily  Patient denies hypoglycemic events.  Patient reported dietary habits:  - Patient reports sweeping changes in his diet - He has eliminated added sugars and denies intake of sweets - Reports decrease in carbs and increase in water  Patient-reported exercise habits:  - Uses stationary bike - Is active at his place of work   Patient denies nocturia.  Patient denies neuropathy. Patient denies visual changes. Patient reports self foot exams.     O:  POCT glucose: 112  Home fasting CBG: 130s - 170s 2 hour post-prandial/random CBG: 110s-120s  Lab Results  Component Value Date   HGBA1C 14.9 (H) 12/25/2018   There were no vitals filed for this visit.  Lipid Panel     Component Value Date/Time   CHOL 263 (H) 12/26/2018 0248   TRIG 127 12/26/2018 0248   HDL 43 12/26/2018 0248   CHOLHDL 6.1 12/26/2018 0248   VLDL 25 12/26/2018 0248   LDLCALC 195 (H) 12/26/2018 0248   Clinical ASCVD: No    A/P: Diabetes longstanding currently uncontrolled; however, home sugars have improved greatly. Patient is able to verbalize appropriate hypoglycemia management plan.    With improved glycemic control, I recommend we deescalate to oral therapy. Pt does not wish to change his regimen. Therefore, I will have him resume his Basaglar at bedtime with his morning sugars being above goal. Will defer to Dr. Wynetta Emery for further changes.   -Encouraged compliance with Basaglar 10 units at bedtime.  -Extensively discussed pathophysiology of DM, recommended lifestyle interventions, dietary effects on glycemic control -Counseled on s/sx of and management of hypoglycemia -Next A1C anticipated 03/2019.  -urine microalb:SCr ratio  ASCVD risk - primary prevention in patient with DM. Last LDL > 190. High intensity statin indicated.  -Continued atorvastatin 40 mg.   HM: - Pneumovax given  - Tetanus not in stock currently in clinic;will defer to next PCP appt  Written patient instructions provided. Total time in face to face counseling 30 minutes.   Follow up Pharmacist Clinic Visit in 2 weeks.  Benard Halsted, PharmD, [place] [phone]

## 2019-02-04 NOTE — Patient Instructions (Addendum)
Thank you for coming to see me today. Please do the following:   1. Continue Basaglar 10 units at bedtime.  2. Continue checking blood sugars at home.  3. Continue making the lifestyle changes we've discussed together during our visit. Diet and exercise play a significant role in improving your blood sugars.  4. Follow-up with Dr. Wynetta Emery at the end of next month.    Hypoglycemia or low blood sugar:   Low blood sugar can happen quickly and may become an emergency if not treated right away.   While this shouldn't happen often, it can be brought upon if you skip a meal or do not eat enough. Also, if your insulin or other diabetes medications are dosed too high, this can cause your blood sugar to go to low.   Warning signs of low blood sugar include: 1. Feeling shaky or dizzy 2. Feeling weak or tired  3. Excessive hunger 4. Feeling anxious or upset  5. Sweating even when you aren't exercising  What to do if I experience low blood sugar? 1. Check your blood sugar with your meter. If lower than 70, proceed to step 2.  2. Treat with 3-4 glucose tablets or 3 packets of regular sugar. If these aren't around, you can try hard candy. Yet another option would be to drink 4 ounces of fruit juice or 6 ounces of REGULAR soda.  3. Re-check your sugar in 15 minutes. If it is still below 70, do what you did in step 2 again. If has come back up, go ahead and eat a snack or small meal at this time.

## 2019-02-05 ENCOUNTER — Encounter: Payer: Self-pay | Admitting: Pharmacist

## 2019-02-06 LAB — MICROALBUMIN / CREATININE URINE RATIO
Creatinine, Urine: 325.1 mg/dL
Microalb/Creat Ratio: 4 mg/g creat (ref 0–29)
Microalbumin, Urine: 14.4 ug/mL

## 2019-02-28 ENCOUNTER — Encounter (HOSPITAL_COMMUNITY): Payer: Self-pay

## 2019-02-28 ENCOUNTER — Other Ambulatory Visit: Payer: Self-pay

## 2019-02-28 ENCOUNTER — Encounter (HOSPITAL_COMMUNITY): Payer: Self-pay | Admitting: Emergency Medicine

## 2019-02-28 ENCOUNTER — Emergency Department (HOSPITAL_COMMUNITY)
Admission: EM | Admit: 2019-02-28 | Discharge: 2019-02-28 | Disposition: A | Discharge status: Home / Self Care | Payer: Self-pay | Attending: Emergency Medicine | Admitting: Emergency Medicine

## 2019-02-28 DIAGNOSIS — E119 Type 2 diabetes mellitus without complications: Secondary | ICD-10-CM | POA: Insufficient documentation

## 2019-02-28 DIAGNOSIS — R1013 Epigastric pain: Secondary | ICD-10-CM | POA: Insufficient documentation

## 2019-02-28 DIAGNOSIS — Z6836 Body mass index (BMI) 36.0-36.9, adult: Secondary | ICD-10-CM | POA: Insufficient documentation

## 2019-02-28 DIAGNOSIS — Z794 Long term (current) use of insulin: Secondary | ICD-10-CM | POA: Insufficient documentation

## 2019-02-28 DIAGNOSIS — Z79899 Other long term (current) drug therapy: Secondary | ICD-10-CM | POA: Insufficient documentation

## 2019-02-28 DIAGNOSIS — K8012 Calculus of gallbladder with acute and chronic cholecystitis without obstruction: Principal | ICD-10-CM | POA: Insufficient documentation

## 2019-02-28 DIAGNOSIS — R110 Nausea: Secondary | ICD-10-CM | POA: Insufficient documentation

## 2019-02-28 DIAGNOSIS — R142 Eructation: Secondary | ICD-10-CM | POA: Insufficient documentation

## 2019-02-28 DIAGNOSIS — K430 Incisional hernia with obstruction, without gangrene: Secondary | ICD-10-CM | POA: Insufficient documentation

## 2019-02-28 DIAGNOSIS — Z20828 Contact with and (suspected) exposure to other viral communicable diseases: Secondary | ICD-10-CM | POA: Insufficient documentation

## 2019-02-28 LAB — COMPREHENSIVE METABOLIC PANEL
ALT: 30 U/L (ref 0–44)
AST: 24 U/L (ref 15–41)
Albumin: 4.2 g/dL (ref 3.5–5.0)
Alkaline Phosphatase: 64 U/L (ref 38–126)
Anion gap: 15 (ref 5–15)
BUN: 17 mg/dL (ref 6–20)
CO2: 18 mmol/L — ABNORMAL LOW (ref 22–32)
Calcium: 9.5 mg/dL (ref 8.9–10.3)
Chloride: 107 mmol/L (ref 98–111)
Creatinine, Ser: 0.91 mg/dL (ref 0.61–1.24)
GFR calc Af Amer: >60 mL/min (ref >60)
GFR calc non Af Amer: >60 mL/min (ref >60)
Glucose, Bld: 165 mg/dL — ABNORMAL HIGH (ref 70–99)
Potassium: 3.8 mmol/L (ref 3.5–5.1)
Sodium: 140 mmol/L (ref 135–145)
Total Bilirubin: 0.9 mg/dL (ref 0.3–1.2)
Total Protein: 7.7 g/dL (ref 6.5–8.1)

## 2019-02-28 LAB — CBC
HCT: 43.4 % (ref 39.0–52.0)
Hemoglobin: 14.2 g/dL (ref 13.0–17.0)
MCH: 30.1 pg (ref 26.0–34.0)
MCHC: 32.7 g/dL (ref 30.0–36.0)
MCV: 91.9 fL (ref 80.0–100.0)
Platelets: 265 10*3/uL (ref 150–400)
RBC: 4.72 MIL/uL (ref 4.22–5.81)
RDW: 13.2 % (ref 11.5–15.5)
WBC: 7.6 10*3/uL (ref 4.0–10.5)
nRBC: 0 % (ref 0.0–0.2)

## 2019-02-28 LAB — URINALYSIS, ROUTINE W REFLEX MICROSCOPIC
Bilirubin Urine: NEGATIVE
Glucose, UA: NEGATIVE mg/dL
Hgb urine dipstick: NEGATIVE
Ketones, ur: 20 mg/dL — AB
Leukocytes,Ua: NEGATIVE
Nitrite: NEGATIVE
Protein, ur: NEGATIVE mg/dL
Specific Gravity, Urine: 1.024 (ref 1.005–1.030)
pH: 7 (ref 5.0–8.0)

## 2019-02-28 LAB — LIPASE, BLOOD: Lipase: 36 U/L (ref 11–51)

## 2019-02-28 MED ORDER — PANTOPRAZOLE SODIUM 20 MG PO TBEC: 20.0000 mg | DELAYED_RELEASE_TABLET | Freq: Every day | ORAL | 1 refills | Status: DC

## 2019-02-28 MED ORDER — PANTOPRAZOLE SODIUM 40 MG IV SOLR
40.0000 mg | Freq: Once | INTRAVENOUS | Status: AC
  Administered 2019-02-28: 07:00:00 40 mg via INTRAVENOUS
  Filled 2019-02-28: qty 40

## 2019-02-28 MED ORDER — DICYCLOMINE HCL 10 MG/ML IM SOLN
20.0000 mg | Freq: Once | INTRAMUSCULAR | Status: AC
  Administered 2019-02-28: 07:00:00 20 mg via INTRAMUSCULAR
  Filled 2019-02-28: qty 2

## 2019-02-28 MED ORDER — SODIUM CHLORIDE 0.9% FLUSH
3.0000 mL | Freq: Once | INTRAVENOUS | Status: AC
  Administered 2019-02-28: 3 mL via INTRAVENOUS

## 2019-02-28 MED ORDER — DICYCLOMINE HCL 20 MG PO TABS: 20.0000 mg | ORAL_TABLET | Freq: Two times a day (BID) | ORAL | 0 refills | Status: DC | PRN

## 2019-02-28 MED ORDER — SODIUM CHLORIDE 0.9 % IV BOLUS
500.0000 mL | Freq: Once | INTRAVENOUS | Status: AC
  Administered 2019-02-28: 07:00:00 500 mL via INTRAVENOUS

## 2019-02-28 MED ORDER — LIDOCAINE VISCOUS HCL 2 % MT SOLN
15.0000 mL | Freq: Once | OROMUCOSAL | Status: AC
  Administered 2019-02-28: 15 mL via ORAL
  Filled 2019-02-28: qty 15

## 2019-02-28 MED ORDER — ALUM & MAG HYDROXIDE-SIMETH 200-200-20 MG/5ML PO SUSP
30.0000 mL | Freq: Once | ORAL | Status: AC
  Administered 2019-02-28: 30 mL via ORAL
  Filled 2019-02-28: qty 30

## 2019-02-28 NOTE — ED Triage Notes (Signed)
Pt reports RUQ pain. He was seen last night for same and states that he felt much better when he left, but the pain and belching returned. Denies vomiting or diarrhea. Hx diabetes and GERD. Ambulatory.

## 2019-02-28 NOTE — Discharge Instructions (Addendum)
Take Bentyl twice daily as needed for abdominal pain or cramping.  Take Protonix once daily as prescribed.  Please follow-up with your primary care doctor and/or the GI doctor below for further evaluation and treatment of your symptoms.  Please return the emergency department if you develop any new or worsening symptoms.

## 2019-02-28 NOTE — ED Provider Notes (Signed)
Signout from [employer], [place], at shift change See previous providers note for full H&P  Briefly, patient presenting with epigastric pain.  He has history of the same in the past and had a negative right upper quadrant ultrasound.  Abdominal exam is benign.  Labs are unremarkable.  Patient has been given symptomatic treatment and plan to reassess.  Plan to discharge home with PCP and GI follow-up as well as Protonix and Bentyl.  Physical Exam  BP (!) 147/87 (BP Location: Left Arm)   Pulse (!) 52   Temp 98 F (36.7 C) (Oral)   Resp 18   SpO2 100%   Physical Exam HENT:     Head: Normocephalic and atraumatic.  Cardiovascular:     Rate and Rhythm: Normal rate.  Pulmonary:     Effort: Pulmonary effort is normal.  Abdominal:     Tenderness: There is no abdominal tenderness. There is no rebound. Negative signs include Murphy's sign.     Comments: Central longitudinal surgical scar  Neurological:     Mental Status: He is alert.     ED Course/Procedures     Procedures  MDM  On reassessment, patient is feeling much better.  Will discharge home with Protonix and Bentyl as planned.  Follow-up to PCP and GI for further management.  Patient also given guidance on food choices for GERD.  Return precautions discussed.  Patient understands and agrees with plan.  Patient vitals stable throughout ED course and discharged in satisfactory condition.       Frederica Kuster, PA-C 02/28/19 [id number]    Fatima Blank, MD 02/28/19 [phone]
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
## 2019-02-28 NOTE — ED Triage Notes (Signed)
Patient is complaining of abdominal pain, nausea, and vomiting. Patient has a hx of diabetes and epigastric pain.

## 2019-03-01 ENCOUNTER — Encounter (HOSPITAL_COMMUNITY)
Admission: EM | Disposition: A | Discharge status: Home / Self Care | Payer: Self-pay | Source: Home / Self Care | Attending: Emergency Medicine

## 2019-03-01 ENCOUNTER — Observation Stay (HOSPITAL_COMMUNITY)
Admission: EM | Admit: 2019-03-01 | Discharge: 2019-03-02 | Disposition: A | Discharge status: Home / Self Care | Payer: Self-pay | Attending: General Surgery | Admitting: General Surgery

## 2019-03-01 ENCOUNTER — Emergency Department (HOSPITAL_COMMUNITY): Payer: Self-pay | Admitting: Anesthesiology

## 2019-03-01 ENCOUNTER — Emergency Department (HOSPITAL_COMMUNITY): Payer: Self-pay

## 2019-03-01 DIAGNOSIS — K8020 Calculus of gallbladder without cholecystitis without obstruction: Secondary | ICD-10-CM

## 2019-03-01 DIAGNOSIS — R1011 Right upper quadrant pain: Secondary | ICD-10-CM

## 2019-03-01 DIAGNOSIS — K8000 Calculus of gallbladder with acute cholecystitis without obstruction: Secondary | ICD-10-CM

## 2019-03-01 DIAGNOSIS — K810 Acute cholecystitis: Secondary | ICD-10-CM | POA: Diagnosis present

## 2019-03-01 HISTORY — PX: CHOLECYSTECTOMY: SHX55

## 2019-03-01 LAB — GLUCOSE, CAPILLARY
Glucose-Capillary: 119 mg/dL — ABNORMAL HIGH (ref 70–99)
Glucose-Capillary: 201 mg/dL — ABNORMAL HIGH (ref 70–99)
Glucose-Capillary: 96 mg/dL (ref 70–99)

## 2019-03-01 LAB — SARS CORONAVIRUS 2 BY RT PCR (HOSPITAL ORDER, PERFORMED IN ~~LOC~~ HOSPITAL LAB): SARS Coronavirus 2: NEGATIVE

## 2019-03-01 SURGERY — LAPAROSCOPIC CHOLECYSTECTOMY WITH INTRAOPERATIVE CHOLANGIOGRAM
Anesthesia: General | Site: Abdomen

## 2019-03-01 MED ORDER — LIDOCAINE 2% (20 MG/ML) 5 ML SYRINGE
INTRAMUSCULAR | Status: AC
  Filled 2019-03-01: qty 5

## 2019-03-01 MED ORDER — ONDANSETRON 4 MG PO TBDP: 4.0000 mg | ORAL_TABLET | Freq: Four times a day (QID) | ORAL | Status: DC | PRN

## 2019-03-01 MED ORDER — ACETAMINOPHEN 10 MG/ML IV SOLN: 1000.0000 mg | Freq: Once | INTRAVENOUS | Status: DC | PRN

## 2019-03-01 MED ORDER — MORPHINE SULFATE (PF) 2 MG/ML IV SOLN: 2.0000 mg | INTRAVENOUS | Status: DC | PRN

## 2019-03-01 MED ORDER — FENTANYL CITRATE (PF) 100 MCG/2ML IJ SOLN
INTRAMUSCULAR | Status: AC
  Filled 2019-03-01: qty 2

## 2019-03-01 MED ORDER — FENTANYL CITRATE (PF) 100 MCG/2ML IJ SOLN
100.0000 ug | Freq: Once | INTRAMUSCULAR | Status: AC
  Administered 2019-03-01: 100 ug via INTRAVENOUS
  Filled 2019-03-01: qty 2

## 2019-03-01 MED ORDER — FENTANYL CITRATE (PF) 250 MCG/5ML IJ SOLN
INTRAMUSCULAR | Status: AC
  Filled 2019-03-01: qty 5

## 2019-03-01 MED ORDER — SUGAMMADEX SODIUM 200 MG/2ML IV SOLN
INTRAVENOUS | Status: DC | PRN
  Administered 2019-03-01: 200 mg via INTRAVENOUS

## 2019-03-01 MED ORDER — INSULIN GLARGINE 100 UNIT/ML ~~LOC~~ SOLN
5.0000 [IU] | Freq: Every day | SUBCUTANEOUS | Status: DC
  Administered 2019-03-01: 5 [IU] via SUBCUTANEOUS
  Filled 2019-03-01: qty 0.05

## 2019-03-01 MED ORDER — ONDANSETRON HCL 4 MG/2ML IJ SOLN
INTRAMUSCULAR | Status: DC | PRN
  Administered 2019-03-01: 4 mg via INTRAVENOUS

## 2019-03-01 MED ORDER — OXYCODONE HCL 5 MG/5ML PO SOLN: 5.0000 mg | Freq: Once | ORAL | Status: DC | PRN

## 2019-03-01 MED ORDER — DEXAMETHASONE SODIUM PHOSPHATE 10 MG/ML IJ SOLN
INTRAMUSCULAR | Status: AC
  Filled 2019-03-01: qty 1

## 2019-03-01 MED ORDER — ROCURONIUM BROMIDE 10 MG/ML (PF) SYRINGE
PREFILLED_SYRINGE | INTRAVENOUS | Status: DC | PRN
  Administered 2019-03-01: 50 mg via INTRAVENOUS

## 2019-03-01 MED ORDER — HYDROCODONE-ACETAMINOPHEN 5-325 MG PO TABS
1.0000 | ORAL_TABLET | ORAL | Status: DC | PRN
  Administered 2019-03-01 (×2): 1 via ORAL
  Filled 2019-03-01 (×3): qty 1

## 2019-03-01 MED ORDER — BUPIVACAINE-EPINEPHRINE 0.25% -1:200000 IJ SOLN
INTRAMUSCULAR | Status: DC | PRN
  Administered 2019-03-01: 30 mL

## 2019-03-01 MED ORDER — EPHEDRINE SULFATE-NACL 50-0.9 MG/10ML-% IV SOSY
PREFILLED_SYRINGE | INTRAVENOUS | Status: DC | PRN
  Administered 2019-03-01: 10 mg via INTRAVENOUS

## 2019-03-01 MED ORDER — ENOXAPARIN SODIUM 40 MG/0.4ML ~~LOC~~ SOLN
40.0000 mg | SUBCUTANEOUS | Status: DC
  Administered 2019-03-02: 40 mg via SUBCUTANEOUS
  Filled 2019-03-01: qty 0.4

## 2019-03-01 MED ORDER — SODIUM CHLORIDE 0.9 % IV SOLN
2.0000 g | Freq: Once | INTRAVENOUS | Status: AC
  Administered 2019-03-01: 2 g via INTRAVENOUS
  Filled 2019-03-01: qty 20

## 2019-03-01 MED ORDER — ROCURONIUM BROMIDE 10 MG/ML (PF) SYRINGE
PREFILLED_SYRINGE | INTRAVENOUS | Status: AC
  Filled 2019-03-01: qty 10

## 2019-03-01 MED ORDER — INSULIN ASPART 100 UNIT/ML ~~LOC~~ SOLN
0.0000 [IU] | Freq: Three times a day (TID) | SUBCUTANEOUS | Status: DC
  Administered 2019-03-01: 7 [IU] via SUBCUTANEOUS

## 2019-03-01 MED ORDER — ONDANSETRON HCL 4 MG/2ML IJ SOLN
4.0000 mg | Freq: Once | INTRAMUSCULAR | Status: AC
  Administered 2019-03-01: 4 mg via INTRAVENOUS
  Filled 2019-03-01: qty 2

## 2019-03-01 MED ORDER — INSULIN ASPART 100 UNIT/ML ~~LOC~~ SOLN: 0.0000 [IU] | Freq: Every day | SUBCUTANEOUS | Status: DC

## 2019-03-01 MED ORDER — MIDAZOLAM HCL 5 MG/5ML IJ SOLN
INTRAMUSCULAR | Status: DC | PRN
  Administered 2019-03-01: 2 mg via INTRAVENOUS

## 2019-03-01 MED ORDER — PROPOFOL 10 MG/ML IV BOLUS
INTRAVENOUS | Status: DC | PRN
  Administered 2019-03-01: 200 mg via INTRAVENOUS

## 2019-03-01 MED ORDER — SENNOSIDES-DOCUSATE SODIUM 8.6-50 MG PO TABS
1.0000 | ORAL_TABLET | Freq: Every day | ORAL | Status: DC
  Administered 2019-03-01: 1 via ORAL
  Filled 2019-03-01: qty 1

## 2019-03-01 MED ORDER — LIDOCAINE 2% (20 MG/ML) 5 ML SYRINGE
INTRAMUSCULAR | Status: DC | PRN
  Administered 2019-03-01 (×2): 100 mg via INTRAVENOUS

## 2019-03-01 MED ORDER — DICYCLOMINE HCL 10 MG/ML IM SOLN
20.0000 mg | Freq: Once | INTRAMUSCULAR | Status: AC
  Administered 2019-03-01: 02:00:00 20 mg via INTRAMUSCULAR
  Filled 2019-03-01: qty 2

## 2019-03-01 MED ORDER — ONDANSETRON HCL 4 MG/2ML IJ SOLN
INTRAMUSCULAR | Status: AC
  Filled 2019-03-01: qty 2

## 2019-03-01 MED ORDER — LACTATED RINGERS IV SOLN
INTRAVENOUS | Status: AC | PRN
  Administered 2019-03-01: 1000 mL

## 2019-03-01 MED ORDER — ACETAMINOPHEN 500 MG PO TABS: 1000.0000 mg | ORAL_TABLET | Freq: Once | ORAL | Status: DC | PRN

## 2019-03-01 MED ORDER — ONDANSETRON HCL 4 MG/2ML IJ SOLN: 4.0000 mg | Freq: Four times a day (QID) | INTRAMUSCULAR | Status: DC | PRN

## 2019-03-01 MED ORDER — PANTOPRAZOLE SODIUM 40 MG IV SOLR
40.0000 mg | Freq: Once | INTRAVENOUS | Status: AC
  Administered 2019-03-01: 40 mg via INTRAVENOUS
  Filled 2019-03-01: qty 40

## 2019-03-01 MED ORDER — DEXAMETHASONE SODIUM PHOSPHATE 10 MG/ML IJ SOLN
INTRAMUSCULAR | Status: DC | PRN
  Administered 2019-03-01: 10 mg via INTRAVENOUS

## 2019-03-01 MED ORDER — SODIUM CHLORIDE (PF) 0.9 % IJ SOLN
INTRAMUSCULAR | Status: AC
  Filled 2019-03-01: qty 50

## 2019-03-01 MED ORDER — FENTANYL CITRATE (PF) 100 MCG/2ML IJ SOLN
INTRAMUSCULAR | Status: DC | PRN
  Administered 2019-03-01: 50 ug via INTRAVENOUS
  Administered 2019-03-01 (×2): 100 ug via INTRAVENOUS

## 2019-03-01 MED ORDER — SUGAMMADEX SODIUM 500 MG/5ML IV SOLN
INTRAVENOUS | Status: AC
  Filled 2019-03-01: qty 5

## 2019-03-01 MED ORDER — IOHEXOL 300 MG/ML  SOLN
100.0000 mL | Freq: Once | INTRAMUSCULAR | Status: AC | PRN
  Administered 2019-03-01: 100 mL via INTRAVENOUS

## 2019-03-01 MED ORDER — BISACODYL 10 MG RE SUPP: 10.0000 mg | Freq: Every day | RECTAL | Status: DC | PRN

## 2019-03-01 MED ORDER — BUPIVACAINE-EPINEPHRINE (PF) 0.25% -1:200000 IJ SOLN
INTRAMUSCULAR | Status: AC
  Filled 2019-03-01: qty 30

## 2019-03-01 MED ORDER — KCL IN DEXTROSE-NACL 20-5-0.45 MEQ/L-%-% IV SOLN
INTRAVENOUS | Status: DC
  Administered 2019-03-01: 13:00:00 via INTRAVENOUS
  Filled 2019-03-01: qty 1000

## 2019-03-01 MED ORDER — PROPOFOL 10 MG/ML IV BOLUS
INTRAVENOUS | Status: AC
  Filled 2019-03-01: qty 20

## 2019-03-01 MED ORDER — PANTOPRAZOLE SODIUM 20 MG PO TBEC
20.0000 mg | DELAYED_RELEASE_TABLET | Freq: Every day | ORAL | Status: DC
  Administered 2019-03-01 – 2019-03-02 (×2): 20 mg via ORAL
  Filled 2019-03-01 (×2): qty 1

## 2019-03-01 MED ORDER — ACETAMINOPHEN 160 MG/5ML PO SOLN: 1000.0000 mg | Freq: Once | ORAL | Status: DC | PRN

## 2019-03-01 MED ORDER — 0.9 % SODIUM CHLORIDE (POUR BTL) OPTIME
TOPICAL | Status: DC | PRN
  Administered 2019-03-01: 1000 mL

## 2019-03-01 MED ORDER — EPHEDRINE 5 MG/ML INJ
INTRAVENOUS | Status: AC
  Filled 2019-03-01: qty 10

## 2019-03-01 MED ORDER — LACTATED RINGERS IV SOLN
INTRAVENOUS | Status: DC | PRN
  Administered 2019-03-01: 08:00:00 via INTRAVENOUS

## 2019-03-01 MED ORDER — FENTANYL CITRATE (PF) 100 MCG/2ML IJ SOLN
25.0000 ug | INTRAMUSCULAR | Status: DC | PRN
  Administered 2019-03-01 (×2): 50 ug via INTRAVENOUS

## 2019-03-01 MED ORDER — OXYCODONE HCL 5 MG PO TABS: 5.0000 mg | ORAL_TABLET | Freq: Once | ORAL | Status: DC | PRN

## 2019-03-01 MED ORDER — MIDAZOLAM HCL 2 MG/2ML IJ SOLN
INTRAMUSCULAR | Status: AC
  Filled 2019-03-01: qty 2

## 2019-03-01 MED ORDER — SIMETHICONE 80 MG PO CHEW: 40.0000 mg | CHEWABLE_TABLET | Freq: Four times a day (QID) | ORAL | Status: DC | PRN

## 2019-03-01 SURGICAL SUPPLY — 45 items
APPLIER CLIP 5 13 M/L LIGAMAX5 (MISCELLANEOUS) ×3
BINDER UNISEX CHEST M 46X13 (GAUZE/BANDAGES/DRESSINGS) ×3 IMPLANT
CABLE HIGH FREQUENCY MONO STRZ (ELECTRODE) ×3 IMPLANT
CHLORAPREP W/TINT 26 (MISCELLANEOUS) ×3 IMPLANT
CLIP APPLIE 5 13 M/L LIGAMAX5 (MISCELLANEOUS) ×1 IMPLANT
COVER MAYO STAND STRL (DRAPES) ×3 IMPLANT
COVER SURGICAL LIGHT HANDLE (MISCELLANEOUS) ×6 IMPLANT
COVER WAND RF STERILE (DRAPES) IMPLANT
DERMABOND ADVANCED (GAUZE/BANDAGES/DRESSINGS) ×2
DERMABOND ADVANCED .7 DNX12 (GAUZE/BANDAGES/DRESSINGS) ×1 IMPLANT
DEVICE TROCAR PUNCTURE CLOSURE (ENDOMECHANICALS) IMPLANT
DRAPE C-ARM 42X120 X-RAY (DRAPES) ×3 IMPLANT
DRAPE LAPAROSCOPIC ABDOMINAL (DRAPES) IMPLANT
DRSG OPSITE POSTOP 4X6 (GAUZE/BANDAGES/DRESSINGS) ×3 IMPLANT
ELECT L-HOOK LAP 45CM DISP (ELECTROSURGICAL) ×3
ELECT REM PT RETURN 15FT ADLT (MISCELLANEOUS) ×3 IMPLANT
ELECTRODE L-HOOK LAP 45CM DISP (ELECTROSURGICAL) ×1 IMPLANT
GLOVE BIO SURGEON STRL SZ 6.5 (GLOVE) ×2 IMPLANT
GLOVE BIO SURGEONS STRL SZ 6.5 (GLOVE) ×1
GLOVE BIOGEL PI IND STRL 7.0 (GLOVE) ×1 IMPLANT
GLOVE BIOGEL PI INDICATOR 7.0 (GLOVE) ×2
GOWN STRL REUS W/TWL 2XL LVL3 (GOWN DISPOSABLE) ×3 IMPLANT
GOWN STRL REUS W/TWL XL LVL3 (GOWN DISPOSABLE) ×6 IMPLANT
IRRIG SUCT STRYKERFLOW 2 WTIP (MISCELLANEOUS) ×3
IRRIGATION SUCT STRKRFLW 2 WTP (MISCELLANEOUS) ×1 IMPLANT
IV CATH 14GX2 1/4 (CATHETERS) ×3 IMPLANT
KIT BASIN OR (CUSTOM PROCEDURE TRAY) ×3 IMPLANT
KIT TURNOVER KIT A (KITS) IMPLANT
PORT LAP GEL ALEXIS MED 5-9CM (MISCELLANEOUS) ×3 IMPLANT
POUCH SPECIMEN RETRIEVAL 10MM (ENDOMECHANICALS) ×3 IMPLANT
SCISSORS LAP 5X35 DISP (ENDOMECHANICALS) ×3 IMPLANT
SET CHOLANGIOGRAPH MIX (MISCELLANEOUS) ×3 IMPLANT
SET TUBE SMOKE EVAC HIGH FLOW (TUBING) ×3 IMPLANT
SLEEVE XCEL OPT CAN 5 100 (ENDOMECHANICALS) ×6 IMPLANT
SUT NOVA NAB DX-16 0-1 5-0 T12 (SUTURE) ×6 IMPLANT
SUT VIC AB 2-0 SH 27 (SUTURE) ×2
SUT VIC AB 2-0 SH 27X BRD (SUTURE) ×1 IMPLANT
SUT VIC AB 4-0 PS2 18 (SUTURE) ×3 IMPLANT
SUT VICRYL 0 UR6 27IN ABS (SUTURE) IMPLANT
TOWEL OR 17X26 10 PK STRL BLUE (TOWEL DISPOSABLE) ×3 IMPLANT
TOWEL OR NON WOVEN STRL DISP B (DISPOSABLE) ×3 IMPLANT
TRAY LAPAROSCOPIC (CUSTOM PROCEDURE TRAY) ×3 IMPLANT
TROCAR BLADELESS OPT 5 100 (ENDOMECHANICALS) ×3 IMPLANT
TROCAR XCEL BLUNT TIP 100MML (ENDOMECHANICALS) ×3 IMPLANT
TROCAR XCEL NON-BLD 11X100MML (ENDOMECHANICALS) IMPLANT

## 2019-03-01 NOTE — Op Note (Signed)
03/01/2019  10:52 AM  PATIENT:  Stephen Knight  53 y.o. male  Patient Care Team: Ladell Pier, MD as PCP - General (Internal Medicine)  PRE-OPERATIVE DIAGNOSIS:  cholecystitis  POST-OPERATIVE DIAGNOSIS:  Cholecystitis, incisional hernia  PROCEDURE:  LAPAROSCOPIC CHOLECYSTECTOMY, INCISIONAL HERNIA REPAIR    Surgeon(s): Leighton Ruff, MD  ASSISTANT: Dr Barry Dienes   ANESTHESIA:   local and general  EBL: 25 ml Total I/O In: -  Out: 25 [Blood:25]  DRAINS: none   SPECIMEN:  Source of Specimen:  Gallbladder  DISPOSITION OF SPECIMEN:  PATHOLOGY  COUNTS:  YES  PLAN OF CARE: Admit for overnight observation  PATIENT DISPOSITION:  PACU - hemodynamically stable.  INDICATION: 53 y.o. M with acute cholecystitis  The anatomy & physiology of hepatobiliary & pancreatic function was discussed.  The pathophysiology of gallbladder dysfunction was discussed.  Natural history risks without surgery was discussed.   I feel the risks of no intervention will lead to serious problems that outweigh the operative risks; therefore, I recommended cholecystectomy to remove the pathology.  I explained laparoscopic techniques with possible need for an open approach.  Probable cholangiogram to evaluate the bilary tract was explained as well.    Risks such as bleeding, infection, abscess, leak, injury to other organs, need for further treatment, heart attack, death, and other risks were discussed.  I noted a good likelihood this will help address the problem.  Possibility that this will not correct all abdominal symptoms was explained.  Goals of post-operative recovery were discussed as well.    OR FINDINGS: cholecystitis, significant upper abdominal adhesions  DESCRIPTION:   The patient was identified & brought into the operating room. The patient was positioned supine with arms tucked. SCDs were active during the entire case. The patient underwent general anesthesia without any difficulty.  The abdomen  was prepped and draped in a sterile fashion. A Surgical Timeout was performed and confirmed our plan.  We positioned the patient in reverse [place] & right side up.  I placed a Hassan laparoscopic port through the umbilicus using open entry technique.  Entry was clean but there was significant omental adhesions and an incisional hernia at midline.   We induced carbon dioxide insufflation. Camera inspection revealed no injury.    I proceeded to continue with laparoscopic technique. I placed a 5 mm port in LUQ.  I was then able to take down the omental adhesions in the upper midline portion of the wound using sharp dissection.  We switched the camera to the left upper quadrant port and noticed that there was quite a few omental adhesions at midline.  I was able to clear enough space in the right upper quadrant to place another 5 mm port.  I then attempted to use these to to take down the midline adhesions.  There was an incarcerated incisional hernia noted at the umbilical incision.  I decided to open my incision further and take down this hernia first.  This was done using electrocautery.  The hernia was reduced.  I then placed an Henderson wound protector and cap in the abdominal wound.  The abdomen was reinsufflated.  A 8mm port  was placed in the left subxiphoid region obliquely within the falciform ligament.  I turned attention to the right upper quadrant.  There were significant adhesions to the lateral edge of the gallbladder.  These were taken down using blunt dissection and electrocautery.  The gallbladder fundus was elevated cephalad. I used cautery and blunt dissection to free the  peritoneal coverings between the gallbladder and the liver on the posteriolateral and anteriomedial walls.   I used a suction irrigator to remove the gallbladder contents to allow for retraction.  I used careful blunt and cautery dissection with a suction dissector to help get a good critical view of the cystic artery and  cystic duct. I did further dissection to free a few centimeters of the  gallbladder off the liver bed to get a good critical view of the infundibulum and cystic duct. I mobilized the cystic artery.  I skeletonized the cystic duct.  The patient had an anterior artery near the cystic duct.  This was clipped with clips and cut with scissors.  After getting a good 360 view, I decided to not to perform a cholangiogram.  I placed a clip on the infundibulum.  I placed clips on the cystic duct x3.  I completed cystic duct transection.   I placed clips on the cystic artery x3 with 2 proximally.  I ligated the cystic artery using scissors. I freed the gallbladder from its remaining attachments to the liver. I ensured hemostasis on the gallbladder fossa of the liver and elsewhere. I inspected the rest of the abdomen & detected no injury nor bleeding elsewhere.  I irrigated the RUQ with normal saline.  I removed the gallbladder through the umbilical port site.  I closed the umbilical fascia and incisional hernia using # 1 Novofil interrupted sutures.  I reapproximated the dermal layer using interrupted 2-0 Vicryl sutures.  I closed the skin using 4-0 vicryl stitch.  Sterile dressings were applied. The patient was extubated & arrived in the PACU in stable condition.  I had discussed postoperative care with the patient in the holding area.   I will discuss  operative findings and postoperative goals / instructions with the patient's family.  Instructions are written in the chart as well.
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
## 2019-03-01 NOTE — Anesthesia Procedure Notes (Signed)
Procedure Name: Intubation Date/Time: 03/01/2019 8:56 AM Performed by: Jiovani Mccammon D, CRNA Pre-anesthesia Checklist: Patient identified, Emergency Drugs available, Suction available and Patient being monitored Patient Re-evaluated:Patient Re-evaluated prior to induction Oxygen Delivery Method: Circle system utilized Preoxygenation: Pre-oxygenation with 100% oxygen Induction Type: IV induction Ventilation: Mask ventilation without difficulty Laryngoscope Size: Mac Grade View: Grade I Tube type: Oral Tube size: 7.5 mm Number of attempts: 1 Airway Equipment and Method: Stylet and Oral airway Placement Confirmation: ETT inserted through vocal cords under direct vision,  positive ETCO2 and breath sounds checked- equal and bilateral Secured at: 22 cm Tube secured with: Tape Dental Injury: Teeth and Oropharynx as per pre-operative assessment

## 2019-03-01 NOTE — Anesthesia Preprocedure Evaluation (Signed)
Anesthesia Evaluation  Patient identified by MRN, date of birth, ID band Patient awake    Reviewed: Allergy & Precautions, NPO status , Patient's Chart, lab work & pertinent test results  History of Anesthesia Complications Negative for: history of anesthetic complications  Airway Mallampati: III  TM Distance: >3 FB     Dental  (+) Dental Advisory Given   Pulmonary neg pulmonary ROS, neg recent URI, Not current smoker,    breath sounds clear to auscultation       Cardiovascular negative cardio ROS   Rhythm:Regular     Neuro/Psych negative neurological ROS     GI/Hepatic Neg liver ROS, cholecystitis   Endo/Other  diabetes, Insulin DependentMorbid obesity  Renal/GU negative Renal ROS     Musculoskeletal negative musculoskeletal ROS (+)   Abdominal   Peds  Hematology negative hematology ROS (+)   Anesthesia Other Findings   Reproductive/Obstetrics                             Anesthesia Physical Anesthesia Plan  ASA: II  Anesthesia Plan: General   Post-op Pain Management:    Induction: Intravenous  PONV Risk Score and Plan: 2 and Dexamethasone and Ondansetron  Airway Management Planned: Oral ETT  Additional Equipment: None  Intra-op Plan:   Post-operative Plan: Extubation in OR  Informed Consent: I have reviewed the patients History and Physical, chart, labs and discussed the procedure including the risks, benefits and alternatives for the proposed anesthesia with the patient or authorized representative who has indicated his/her understanding and acceptance.     Dental advisory given  Plan Discussed with: CRNA and Surgeon  Anesthesia Plan Comments:         Anesthesia Quick Evaluation
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
## 2019-03-01 NOTE — ED Notes (Signed)
Pt transported to CT ?

## 2019-03-01 NOTE — ED Notes (Signed)
Writer of this note attempted to start IV x2 - left AC and right AC, with unsuccessful attempt. IV team consult placed.

## 2019-03-01 NOTE — Transfer of Care (Signed)
Immediate Anesthesia Transfer of Care Note  Patient: Stephen Knight  Procedure(s) Performed: LAPAROSCOPIC CHOLECYSTECTOMY (N/A Abdomen)  Patient Location: PACU  Anesthesia Type:General  Level of Consciousness: awake, alert  and oriented  Airway & Oxygen Therapy: Patient Spontanous Breathing and Patient connected to face mask oxygen  Post-op Assessment: Report given to RN and Post -op Vital signs reviewed and stable  Post vital signs: Reviewed and stable  Last Vitals:  Vitals Value Taken Time  BP    Temp    Pulse 104 03/01/19 1103  Resp 16 03/01/19 1103  SpO2 100 % 03/01/19 1103  Vitals shown include unvalidated device data.  Last Pain:  Vitals:   03/01/19 0747  TempSrc:   PainSc: 2          Complications: No apparent anesthesia complications

## 2019-03-01 NOTE — Progress Notes (Signed)
Pre-Op Nursing Note: pts clothing removed and placed in pt belonging bag and labeled. Pts jewelry also removed, items include: one (1) pierced earing, white metal with one clear stone Two (2) yellow metal rings that contain multiple clear stones. Placed in pink denture cup, labeled and placed in pt plastic personal belonging bag.

## 2019-03-02 ENCOUNTER — Encounter (HOSPITAL_COMMUNITY): Payer: Self-pay | Admitting: General Surgery

## 2019-03-02 LAB — GLUCOSE, CAPILLARY: Glucose-Capillary: 116 mg/dL — ABNORMAL HIGH (ref 70–99)

## 2019-03-02 MED ORDER — HYDROCODONE-ACETAMINOPHEN 5-325 MG PO TABS: 1.0000 | ORAL_TABLET | Freq: Four times a day (QID) | ORAL | 0 refills | Status: DC | PRN

## 2019-03-02 MED ORDER — SENNOSIDES-DOCUSATE SODIUM 8.6-50 MG PO TABS: 1.0000 | ORAL_TABLET | Freq: Every day | ORAL | Status: DC

## 2019-03-02 NOTE — Discharge Summary (Signed)
Patient ID: Rock Sobol [id number] 53 y.o. 1966/05/07  03/01/2019  Discharge date and time: 03/02/2019  9:58 AM  Admitting Physician: Rosario Adie  Discharge Physician: Rosario Adie  Admission Diagnoses: RUQ abdominal pain [R10.11] Acute cholecystitis due to biliary calculus [K80.00]  Discharge Diagnoses: Cholecystitis, acute  Operations: Procedure(s): LAPAROSCOPIC CHOLECYSTECTOMY    Discharged Condition: good    Hospital Course: Patient admitted to the hospital after laparoscopic cholecystectomy.  Patient did well overnight.  By the following morning he was tolerating a diet and had good pain control with oral medications.  It was felt that he was in stable condition for discharge.  Consults: None  Significant Diagnostic Studies: labs: cbc, cmet  Treatments: IV hydration, antibiotics: ceftriaxone and surgery: lap appendectomy  Disposition: Home

## 2019-03-02 NOTE — Discharge Instructions (Signed)

## 2019-03-03 NOTE — Anesthesia Postprocedure Evaluation (Signed)
Anesthesia Post Note  Patient: Stephen Knight  Procedure(s) Performed: LAPAROSCOPIC CHOLECYSTECTOMY (N/A Abdomen)     Patient location during evaluation: PACU Anesthesia Type: General Level of consciousness: awake and alert Pain management: pain level controlled Vital Signs Assessment: post-procedure vital signs reviewed and stable Respiratory status: spontaneous breathing, nonlabored ventilation, respiratory function stable and patient connected to nasal cannula oxygen Cardiovascular status: blood pressure returned to baseline and stable Postop Assessment: no apparent nausea or vomiting Anesthetic complications: no    Last Vitals:  Vitals:   03/02/19 0237 03/02/19 0543  BP: 126/79 134/80  Pulse: (!) 59 71  Resp: 18 16  Temp: 37.1 C 37 C  SpO2: 96% 97%    Last Pain:  Vitals:   03/02/19 0800  TempSrc:   PainSc: 4                  Damary Doland

## 2019-03-10 ENCOUNTER — Other Ambulatory Visit: Payer: Self-pay

## 2019-03-10 ENCOUNTER — Ambulatory Visit: Payer: Self-pay | Attending: Internal Medicine | Admitting: Pharmacist

## 2019-03-10 ENCOUNTER — Encounter: Payer: Self-pay | Admitting: Internal Medicine

## 2019-03-10 ENCOUNTER — Ambulatory Visit: Payer: Self-pay | Attending: Internal Medicine | Admitting: Internal Medicine

## 2019-03-10 VITALS — BP 115/79 | HR 87 | Temp 98.7°F | Resp 16 | Ht 71.0 in | Wt 243.4 lb

## 2019-03-10 DIAGNOSIS — E119 Type 2 diabetes mellitus without complications: Secondary | ICD-10-CM

## 2019-03-10 DIAGNOSIS — E669 Obesity, unspecified: Secondary | ICD-10-CM

## 2019-03-10 DIAGNOSIS — Z1211 Encounter for screening for malignant neoplasm of colon: Secondary | ICD-10-CM

## 2019-03-10 DIAGNOSIS — Z23 Encounter for immunization: Secondary | ICD-10-CM

## 2019-03-10 DIAGNOSIS — Z794 Long term (current) use of insulin: Secondary | ICD-10-CM | POA: Insufficient documentation

## 2019-03-10 DIAGNOSIS — E785 Hyperlipidemia, unspecified: Secondary | ICD-10-CM

## 2019-03-10 DIAGNOSIS — E1169 Type 2 diabetes mellitus with other specified complication: Secondary | ICD-10-CM | POA: Insufficient documentation

## 2019-03-10 LAB — GLUCOSE, POCT (MANUAL RESULT ENTRY): POC Glucose: 116 mg/dl — AB (ref 70–99)

## 2019-03-10 MED FILL — TRUEplus LANCETS 28G MISC: 30 days supply | Qty: 100 | Fill #1

## 2019-03-10 MED FILL — ?ATORVASTATIN 40MG TABLET: 40 | 30 days supply | Qty: 30 | Fill #1

## 2019-03-10 MED FILL — TRUE METRIX TEST STRIP: 30 days supply | Qty: 100 | Fill #1

## 2019-03-10 NOTE — Progress Notes (Signed)
Patient presents for vaccination against influenza and tetanus per orders of Dr. Johnson. Consent given. Counseling provided. No contraindications exists. Vaccine administered without incident.   
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
## 2019-03-10 NOTE — Patient Instructions (Signed)

## 2019-04-17 MED FILL — ?ATORVASTATIN 40MG TABLET: 40 | 30 days supply | Qty: 30 | Fill #2

## 2019-04-18 MED FILL — TRUE METRIX TEST STRIP: 30 days supply | Qty: 100 | Fill #2

## 2019-05-15 MED FILL — ?ATORVASTATIN 40MG TABLET: 40 | 30 days supply | Qty: 30 | Fill #3

## 2019-05-15 MED FILL — TRUEplus LANCETS 28G MISC: 30 days supply | Qty: 100 | Fill #2

## 2019-05-16 MED FILL — ?BASAGLAR 100 UNITS/ML KWPE: 100 | 30 days supply | Qty: 3 | Fill #1

## 2019-06-09 ENCOUNTER — Ambulatory Visit: Payer: Self-pay | Attending: Internal Medicine | Admitting: Internal Medicine

## 2019-06-09 ENCOUNTER — Encounter: Payer: Self-pay | Admitting: Internal Medicine

## 2019-06-09 ENCOUNTER — Other Ambulatory Visit: Payer: Self-pay

## 2019-06-09 DIAGNOSIS — E785 Hyperlipidemia, unspecified: Secondary | ICD-10-CM

## 2019-06-09 DIAGNOSIS — Z1211 Encounter for screening for malignant neoplasm of colon: Secondary | ICD-10-CM

## 2019-06-09 DIAGNOSIS — E669 Obesity, unspecified: Secondary | ICD-10-CM

## 2019-06-09 DIAGNOSIS — Z794 Long term (current) use of insulin: Secondary | ICD-10-CM

## 2019-06-09 DIAGNOSIS — E119 Type 2 diabetes mellitus without complications: Secondary | ICD-10-CM

## 2019-06-09 DIAGNOSIS — E1169 Type 2 diabetes mellitus with other specified complication: Secondary | ICD-10-CM

## 2019-06-09 NOTE — Progress Notes (Signed)
Virtual Visit via Telephone Note Due to current restrictions/limitations of in-office visits due to the COVID-19 pandemic, this scheduled clinical appointment was converted to a telehealth visit  I connected with Stephen Knight on 06/09/19 at 2:32 p.m by telephone and verified that I am speaking with the correct person using two identifiers. I am in my office.  The patient is at home.  Only the patient and myself participated in this encounter.  I discussed the limitations, risks, security and privacy concerns of performing an evaluation and management service by telephone and the availability of in person appointments. I also discussed with the patient that there may be a patient responsible charge related to this service. The patient expressed understanding and agreed to proceed.   History of Present Illness: DM, HL, obesity.  Patient last seen in August of this year.  Purpose of today's visit is chronic disease management.  DIABETES TYPE 2 Last A1C:   Results for orders placed or performed in visit on 03/10/19  POCT glucose (manual entry)  Result Value Ref Range   POC Glucose 116 (A) 70 - 99 mg/dl    Med Adherence:  [] Yes    [x] No - does not take Lantus if night time BS is 110-120.  Taking about once a wk Medication side effects:  [] Yes    [x] No Home Monitoring?  [x] Yes 2-3 x a day 2 hrs after meals    [] No Home glucose results range: BS at this time was 122. BS after BF 120-130.  No readings over 180 Diet Adherence: [x] Yes - staying away from "white stuff" and drinking mainly water or V8 Splash diet.  For meats, eating more fish, chicken and [place].  Some times lamb chops    Exercise: [x] Yes - ride his bike once a wk and moves a lot at work.  Works as a Biomedical scientist Hypoglycemic episodes?: [] Yes    [x] No Numbness of the feet? [] Yes    [x] No Retinopathy hx? [] Yes    [] No Last eye exam: 12/2018 Comments:   HL:  Tolerating Lipitor  HM:  Has not done his FIT as yet   Outpatient  Encounter Medications as of 06/09/2019  Medication Sig Note  . atorvastatin (LIPITOR) 40 MG tablet Take 1 tablet (40 mg total) by mouth daily at 6 PM.   . blood glucose meter kit and supplies KIT Dispense based on patient and insurance preference. Use up to four times daily as directed. (FOR ICD-9 250.00, 250.01).   Marland Kitchen dicyclomine (BENTYL) 20 MG tablet Take 1 tablet (20 mg total) by mouth every 12 (twelve) hours as needed (for abdominal pain/cramping). 03/01/2019: Pt has not started  . glucose blood (TRUE METRIX BLOOD GLUCOSE TEST) test strip Use as instructed   . HYDROcodone-acetaminophen (NORCO/VICODIN) 5-325 MG tablet Take 1-2 tablets by mouth every 6 (six) hours as needed for moderate pain. (Patient not taking: Reported on 06/09/2019)   . Insulin Glargine (BASAGLAR KWIKPEN) 100 UNIT/ML SOPN Inject 0.1 mLs (10 Units total) into the skin at bedtime.   . Insulin Pen Needle 31G X 5 MM MISC 1 Container by Does not apply route 4 (four) times daily.   . Multiple Vitamins-Minerals (MULTIVITAMIN MEN) TABS Take 1 tablet by mouth daily.   . Omega-3 Fatty Acids (FISH OIL) 1000 MG CAPS Take 1,000 mg by mouth daily.   Marland Kitchen OVER THE COUNTER MEDICATION Take 1 each by mouth daily. OTC Black Elderberry Gummy   .  pantoprazole (PROTONIX) 20 MG tablet Take 1 tablet (20 mg total) by mouth daily. 03/01/2019: Pt has not started  . senna-docusate (SENOKOT-S) 8.6-50 MG tablet Take 1 tablet by mouth at bedtime.   . TRUEplus Lancets 28G MISC Use to check blood sugar daily.    No facility-administered encounter medications on file as of 06/09/2019.     Observations/Objective: Lab Results  Component Value Date   HGBA1C 14.9 (H) 12/25/2018   Lab Results  Component Value Date   CHOL 263 (H) 12/26/2018   HDL 43 12/26/2018   LDLCALC 195 (H) 12/26/2018   TRIG 127 12/26/2018   CHOLHDL 6.1 12/26/2018     Chemistry      Component Value Date/Time   NA 140 02/28/2019 0353   NA 143 01/07/2019 1449   K 3.8 02/28/2019 0353    CL 107 02/28/2019 0353   CO2 18 (L) 02/28/2019 0353   BUN 17 02/28/2019 0353   BUN 8 01/07/2019 1449   CREATININE 0.91 02/28/2019 0353      Component Value Date/Time   CALCIUM 9.5 02/28/2019 0353   ALKPHOS 64 02/28/2019 0353   AST 24 02/28/2019 0353   ALT 30 02/28/2019 0353   BILITOT 0.9 02/28/2019 0353   BILITOT 0.6 01/07/2019 1449        Assessment and Plan: 1. Controlled type 2 diabetes mellitus without complication, with long-term current use of insulin ([place]) Reported blood sugar readings are at goal.  He is not having to take the Lantus much. Commended him on healthy eating habits.  Encouraged him to keep up the good work. Encouraged him to ride his bike or do some other form of moderate intensity exercise at least 4 days a week for 30 minutes.  He will come to the lab later this week to have blood test done to check his A1c - Hemoglobin A1c; Future  2. Obesity (BMI 30-39.9) See #1 above  3. Hyperlipidemia associated with type 2 diabetes mellitus (HCC) Continue Lipitor - Lipid panel; Future  4. Screening for colon cancer Encourage him to do the fit test and mail it in as soon as possible.  Went over guidelines again regarding colon cancer screening   Follow Up Instructions: 4 mths   I discussed the assessment and treatment plan with the patient. The patient was provided an opportunity to ask questions and all were answered. The patient agreed with the plan and demonstrated an understanding of the instructions.   The patient was advised to call back or seek an in-person evaluation if the symptoms worsen or if the condition fails to improve as anticipated.  I provided 9 minutes of non-face-to-face time during this encounter.   Karle Plumber, MD

## 2019-06-12 ENCOUNTER — Ambulatory Visit: Payer: Self-pay | Attending: Internal Medicine

## 2019-06-12 ENCOUNTER — Other Ambulatory Visit: Payer: Self-pay

## 2019-06-12 DIAGNOSIS — E1169 Type 2 diabetes mellitus with other specified complication: Secondary | ICD-10-CM

## 2019-06-12 DIAGNOSIS — Z794 Long term (current) use of insulin: Secondary | ICD-10-CM

## 2019-06-12 DIAGNOSIS — E119 Type 2 diabetes mellitus without complications: Secondary | ICD-10-CM

## 2019-06-12 MED FILL — TRUE METRIX TEST STRIP: 30 days supply | Qty: 100 | Fill #3

## 2019-06-13 LAB — HEMOGLOBIN A1C
Est. average glucose Bld gHb Est-mCnc: 126 mg/dL
Hgb A1c MFr Bld: 6 % — ABNORMAL HIGH (ref 4.8–5.6)

## 2019-06-13 LAB — LIPID PANEL
Chol/HDL Ratio: 1.6 ratio (ref 0.0–5.0)
Cholesterol, Total: 140 mg/dL (ref 100–199)
HDL: 90 mg/dL (ref >39)
LDL Chol Calc (NIH): 32 mg/dL (ref 0–99)
Triglycerides: 103 mg/dL (ref 0–149)
VLDL Cholesterol Cal: 18 mg/dL (ref 5–40)

## 2019-06-16 ENCOUNTER — Telehealth: Payer: Self-pay

## 2019-06-16 NOTE — Telephone Encounter (Signed)
Contacted pt to go over lab results pt didn't answer left a detailed vm informing pt of results and if he has any questions or concerns to give me a call  

## 2019-06-23 MED FILL — TRUEplus LANCETS 28G MISC: 30 days supply | Qty: 100 | Fill #3

## 2019-06-23 MED FILL — ?ATORVASTATIN 40MG TABLET: 40 | 30 days supply | Qty: 30 | Fill #4

## 2019-07-28 MED FILL — TRUE METRIX TEST STRIP: 30 days supply | Qty: 100 | Fill #4

## 2019-07-28 MED FILL — ATORVASTATIN CALCIUM 40 MG: 40 | 30 days supply | Qty: 30 | Fill #5

## 2019-09-01 MED FILL — ATORVASTATIN CALCIUM 40 MG: 40 | 30 days supply | Qty: 30 | Fill #6

## 2019-09-03 MED FILL — TRUEplus LANCETS 28G MISC: 30 days supply | Qty: 100 | Fill #4

## 2019-10-02 ENCOUNTER — Other Ambulatory Visit: Payer: Self-pay | Admitting: Internal Medicine

## 2019-10-02 DIAGNOSIS — E1169 Type 2 diabetes mellitus with other specified complication: Secondary | ICD-10-CM

## 2019-10-02 MED FILL — ?ATORVASTATIN 40MG TABLET: 40 | 30 days supply | Qty: 30 | Fill #0

## 2019-11-05 MED FILL — ?ATORVASTATIN 40MG TABLET: 40 | 30 days supply | Qty: 30 | Fill #0

## 2019-11-05 MED FILL — TRUEplus LANCETS 28G MISC: 30 days supply | Qty: 100 | Fill #5

## 2019-11-05 MED FILL — TRUE METRIX TEST STRIP: 30 days supply | Qty: 100 | Fill #5

## 2020-01-02 ENCOUNTER — Encounter (HOSPITAL_COMMUNITY): Payer: Self-pay | Admitting: Emergency Medicine

## 2020-01-02 ENCOUNTER — Emergency Department (HOSPITAL_COMMUNITY)
Admission: EM | Admit: 2020-01-02 | Discharge: 2020-01-02 | Disposition: A | Discharge status: Home / Self Care | Payer: Self-pay | Attending: Emergency Medicine | Admitting: Emergency Medicine

## 2020-01-02 DIAGNOSIS — R42 Dizziness and giddiness: Secondary | ICD-10-CM | POA: Insufficient documentation

## 2020-01-02 LAB — CBC
HCT: 44 % (ref 39.0–52.0)
Hemoglobin: 15.3 g/dL (ref 13.0–17.0)
MCH: 31.9 pg (ref 26.0–34.0)
MCHC: 34.8 g/dL (ref 30.0–36.0)
MCV: 91.7 fL (ref 80.0–100.0)
Platelets: 244 10*3/uL (ref 150–400)
RBC: 4.8 MIL/uL (ref 4.22–5.81)
RDW: 13.5 % (ref 11.5–15.5)
WBC: 3.7 10*3/uL — ABNORMAL LOW (ref 4.0–10.5)
nRBC: 0 % (ref 0.0–0.2)

## 2020-01-02 LAB — BASIC METABOLIC PANEL
Anion gap: 12 (ref 5–15)
BUN: 12 mg/dL (ref 6–20)
CO2: 24 mmol/L (ref 22–32)
Calcium: 9.3 mg/dL (ref 8.9–10.3)
Chloride: 104 mmol/L (ref 98–111)
Creatinine, Ser: 0.93 mg/dL (ref 0.61–1.24)
GFR calc Af Amer: >60 mL/min (ref >60)
GFR calc non Af Amer: >60 mL/min (ref >60)
Glucose, Bld: 95 mg/dL (ref 70–99)
Potassium: 3.8 mmol/L (ref 3.5–5.1)
Sodium: 140 mmol/L (ref 135–145)

## 2020-01-02 LAB — CBG MONITORING, ED: Glucose-Capillary: 92 mg/dL (ref 70–99)

## 2020-01-02 NOTE — ED Triage Notes (Signed)
Per EMS-coming from work-states he became dizzy, sluggish-CBG 179

## 2020-01-02 NOTE — ED Notes (Signed)
Pt called for recheck vitals, no response from the lobby 

## 2020-02-02 ENCOUNTER — Other Ambulatory Visit: Payer: Self-pay | Admitting: Internal Medicine

## 2020-02-02 NOTE — Telephone Encounter (Signed)
Requested Prescriptions  Pending Prescriptions Disp Refills  . glucose blood (TRUE METRIX BLOOD GLUCOSE TEST) test strip [Pharmacy Med Name: TRUE METRIX TEST STRIP] 100 each 11    Sig: Use as instructed     Endocrinology: Diabetes - Testing Supplies Passed - 02/02/2020  4:16 PM      Passed - Valid encounter within last 12 months    Recent Outpatient Visits          7 months ago Controlled type 2 diabetes mellitus without complication, with long-term current use of insulin (HCC)   [place] [facility] Jonah Blue B, MD   10 months ago Need for influenza vaccination   [facility] [place], Jeannett Senior L, RPH-CPP   10 months ago Controlled type 2 diabetes mellitus without complication, with long-term current use of insulin [facility])   [place] [facility] Jonah Blue B, MD   12 months ago Uncontrolled type 2 diabetes mellitus without complication, with long-term current use of insulin [facility])   [place] [facility] [place], Jeannett Senior L, RPH-CPP   1 year ago Uncontrolled type 2 diabetes mellitus without complication, with long-term current use of insulin [facility])   [facility] [place], Cornelius Moras, RPH-CPP

## 2020-02-02 NOTE — Telephone Encounter (Signed)
Requested Prescriptions  Pending Prescriptions Disp Refills  . TRUEplus Lancets 28G MISC [Pharmacy Med Name: TRUEplus LANCETS 28G MISC Miscellaneous] 100 each 3    Sig: USE TO CHECK BLOOD SUGAR DAILY.     Endocrinology: Diabetes - Testing Supplies Passed - 02/02/2020  3:49 PM      Passed - Valid encounter within last 12 months    Recent Outpatient Visits          7 months ago Controlled type 2 diabetes mellitus without complication, with long-term current use of insulin (HCC)   [place] [facility] Jonah Blue B, MD   10 months ago Need for influenza vaccination   [facility] [place], Jeannett Senior L, RPH-CPP   10 months ago Controlled type 2 diabetes mellitus without complication, with long-term current use of insulin [facility])   [facility] Jonah Blue B, MD   12 months ago Uncontrolled type 2 diabetes mellitus without complication, with long-term current use of insulin [facility])   [place] [facility] [place], Cornelius Moras, RPH-CPP   1 year ago Uncontrolled type 2 diabetes mellitus without complication, with long-term current use of insulin [facility])   [facility] Drucilla Chalet, RPH-CPP

## 2020-02-03 MED FILL — TRUE METRIX TEST STRIP: 25 days supply | Qty: 100 | Fill #0

## 2020-02-03 MED FILL — TRUEplus LANCETS 28G MISC: 90 days supply | Qty: 100 | Fill #0

## 2020-10-15 IMAGING — CT CT ANGIOGRAPHY CHEST
2 of 6 series · 18 of 46 positions shown · IV contrast (OMNIPAQUE)
Comparison: Thoracic/lumbar spine CT 05/28/2009 and abdominal CT
05/04/2009

CLINICAL DATA: Shortness of breath and fatigue 2 days. Occasional
cough.

EXAM:
CT ANGIOGRAPHY CHEST WITH CONTRAST
TECHNIQUE: Multidetector CT imaging of the chest was performed using the
standard protocol during bolus administration of intravenous
contrast. Multiplanar CT image reconstructions and MIPs were
obtained to evaluate the vascular anatomy.
CONTRAST:  100mL OMNIPAQUE IOHEXOL 350 MG/ML SOLN

[Series 6: thins · axial · 0.75mm/px · z∈[-42,+194]mm · 15 of 260 slices shown]
[im 12/260  lung]
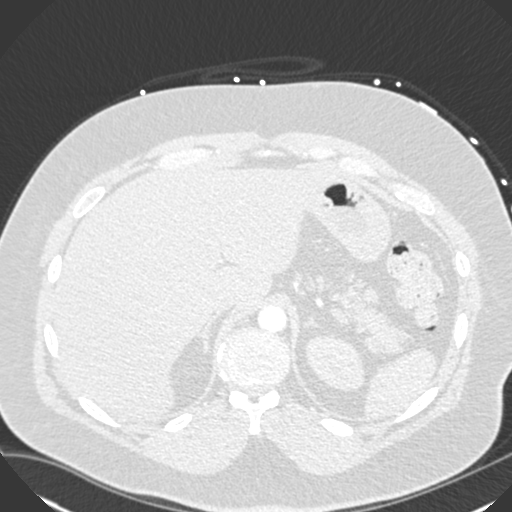
[im 34/260  soft-tissue]
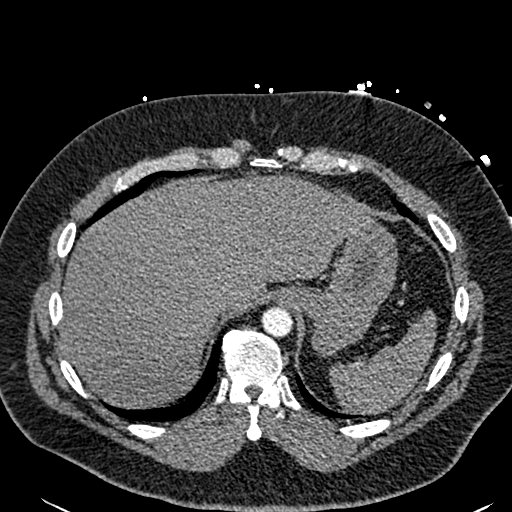
[im 46/260  lung]
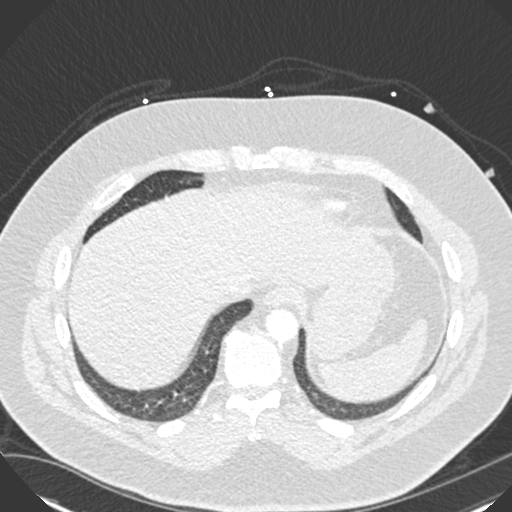
[im 68/260  soft-tissue]
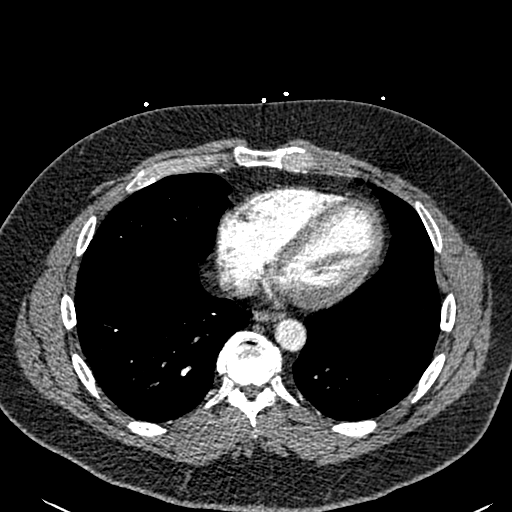
[im 79/260  lung]
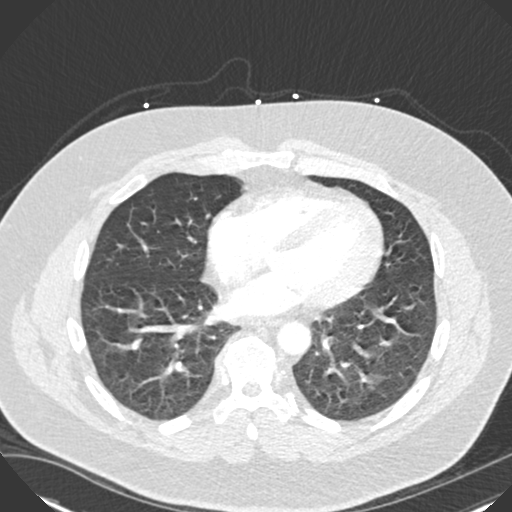
[im 102/260  soft-tissue]
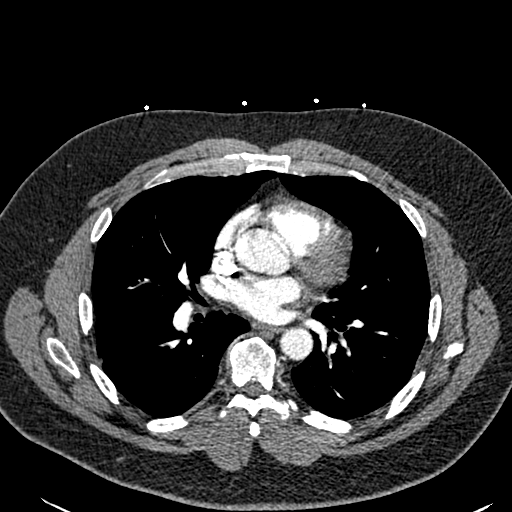
[im 113/260  lung]
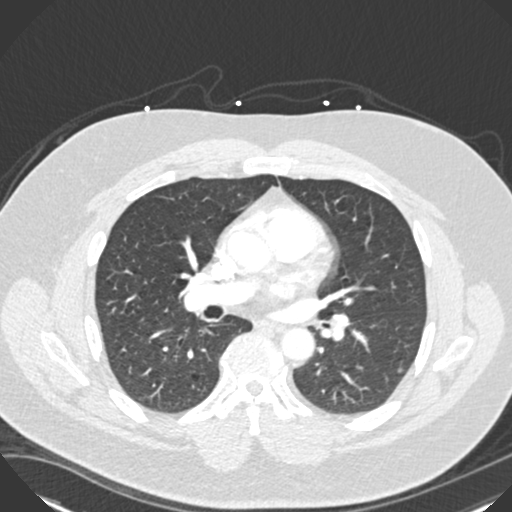
[im 136/260  soft-tissue]
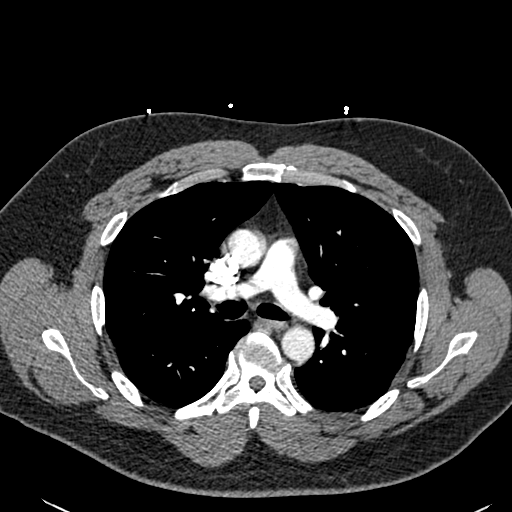
[im 147/260  lung]
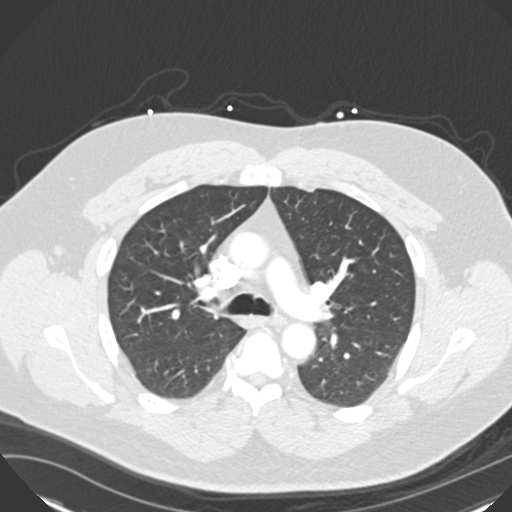
[im 158/260  soft-tissue]
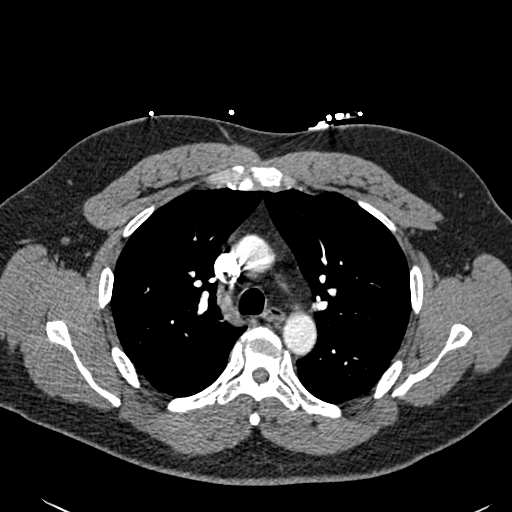
[im 181/260  lung]
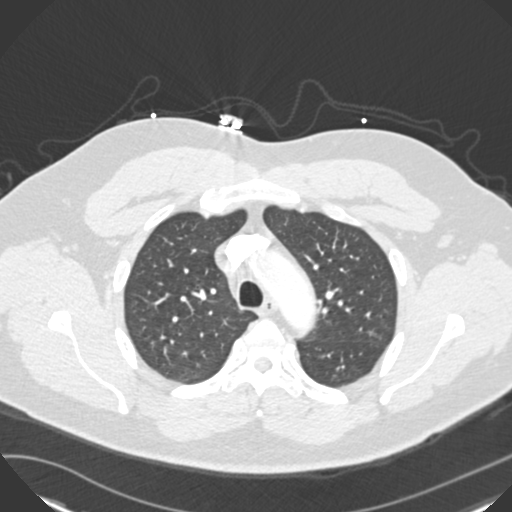
[im 192/260  soft-tissue]
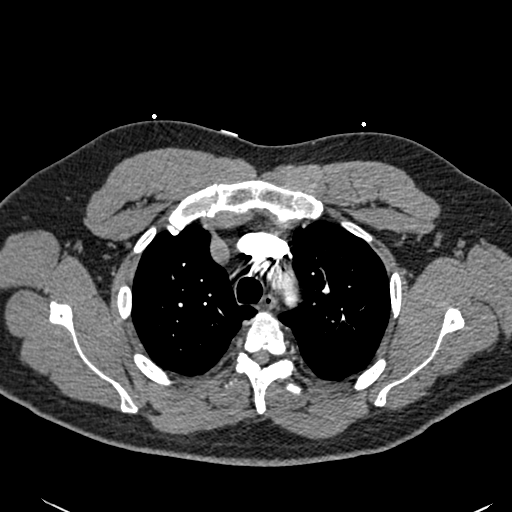
[im 214/260  lung]
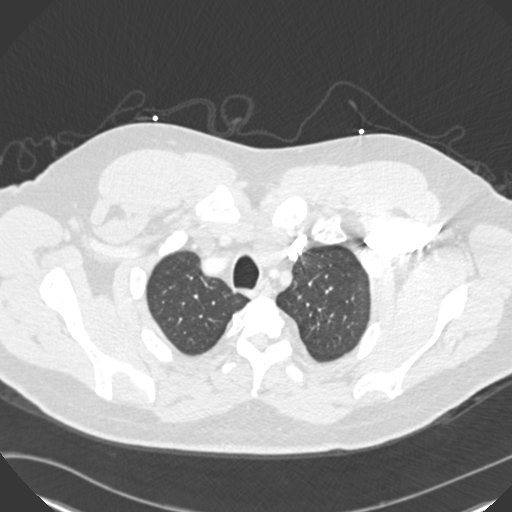
[im 226/260  soft-tissue]
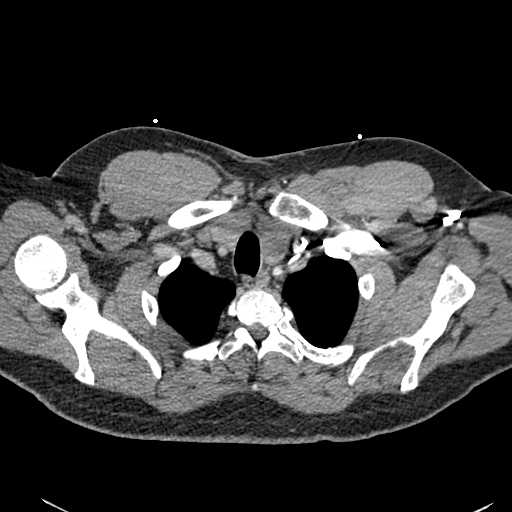
[im 248/260  lung]
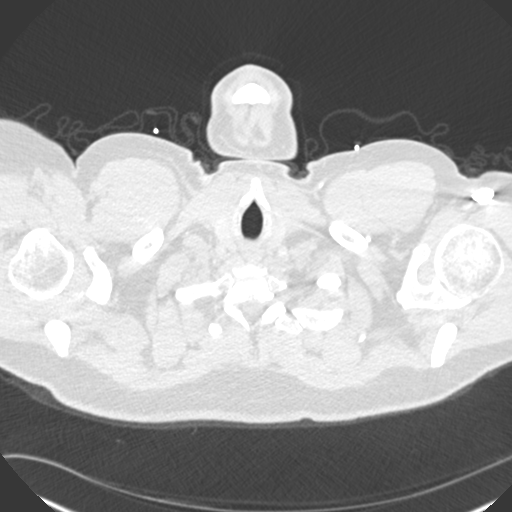

[Series 8: coronal mpr · coronal · 0.52mm/px · 3 of 145 slices shown]
[im 37/145  soft-tissue]
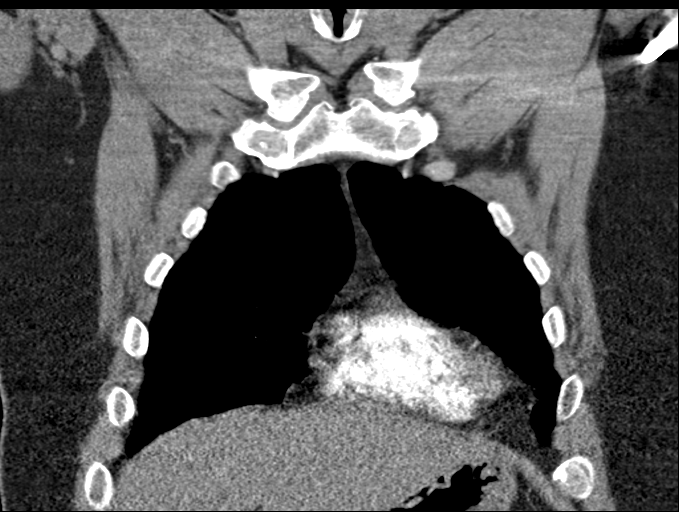
[im 73/145  soft-tissue]
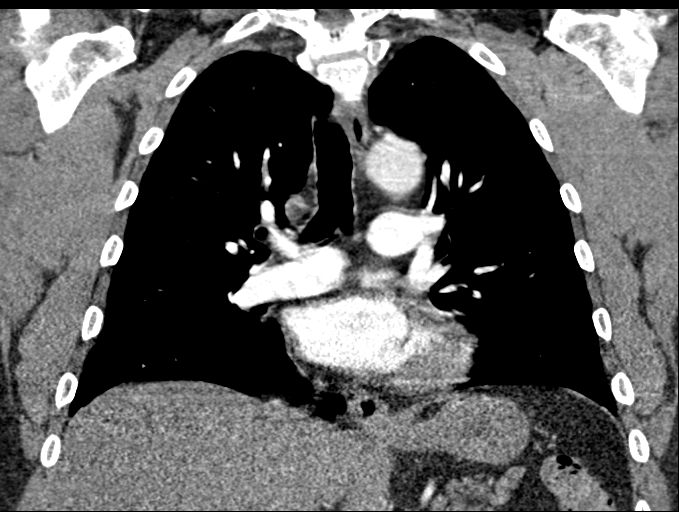
[im 109/145  soft-tissue]
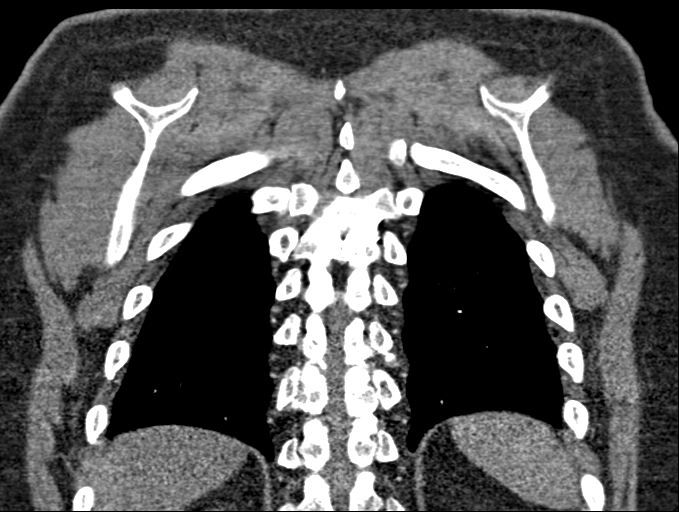

[18 of 46 positions shown; findings below may reference images not displayed]

FINDINGS: Cardiovascular: Heart is normal size. Thoracic aorta is normal
caliber. Pulmonary arterial system is well opacified and otherwise
normal. Remaining vascular structures are unremarkable.

Mediastinum/Nodes: No mediastinal or hilar adenopathy. Remaining
mediastinal structures are normal. Mild prominence of the inferior
left lobe of the thyroid. No axillary adenopathy.

Lungs/Pleura: The lungs are well inflated without consolidation or
effusion. There are several small bilateral subcentimeter peripheral
nodules unchanged from 9373 and therefore benign. Airways are
normal.

Upper Abdomen: No acute findings.

Musculoskeletal: Minimal degenerative change of the spine.

Review of the MIP images confirms the above findings.
IMPRESSION: No acute cardiopulmonary disease and no evidence of pulmonary
embolism.

## 2021-04-10 ENCOUNTER — Emergency Department (HOSPITAL_COMMUNITY): Payer: Self-pay

## 2021-04-10 ENCOUNTER — Emergency Department (HOSPITAL_COMMUNITY)
Admission: EM | Admit: 2021-04-10 | Discharge: 2021-04-10 | Disposition: A | Discharge status: Home / Self Care | Payer: Self-pay | Attending: Emergency Medicine | Admitting: Emergency Medicine

## 2021-04-10 ENCOUNTER — Other Ambulatory Visit: Payer: Self-pay

## 2021-04-10 ENCOUNTER — Encounter (HOSPITAL_COMMUNITY): Payer: Self-pay

## 2021-04-10 DIAGNOSIS — E119 Type 2 diabetes mellitus without complications: Secondary | ICD-10-CM | POA: Insufficient documentation

## 2021-04-10 DIAGNOSIS — Z79899 Other long term (current) drug therapy: Secondary | ICD-10-CM | POA: Insufficient documentation

## 2021-04-10 DIAGNOSIS — S82842A Displaced bimalleolar fracture of left lower leg, initial encounter for closed fracture: Secondary | ICD-10-CM | POA: Insufficient documentation

## 2021-04-10 DIAGNOSIS — S0993XA Unspecified injury of face, initial encounter: Secondary | ICD-10-CM | POA: Insufficient documentation

## 2021-04-10 DIAGNOSIS — W010XXA Fall on same level from slipping, tripping and stumbling without subsequent striking against object, initial encounter: Secondary | ICD-10-CM | POA: Insufficient documentation

## 2021-04-10 DIAGNOSIS — Z794 Long term (current) use of insulin: Secondary | ICD-10-CM | POA: Insufficient documentation

## 2021-04-10 MED ORDER — FENTANYL CITRATE PF 50 MCG/ML IJ SOSY
50.0000 ug | PREFILLED_SYRINGE | Freq: Once | INTRAMUSCULAR | Status: AC
  Administered 2021-04-10: 50 ug via INTRAVENOUS
  Filled 2021-04-10: qty 1

## 2021-04-10 MED ORDER — KETOROLAC TROMETHAMINE 60 MG/2ML IM SOLN
60.0000 mg | Freq: Once | INTRAMUSCULAR | Status: AC
  Administered 2021-04-10: 60 mg via INTRAMUSCULAR
  Filled 2021-04-10: qty 2

## 2021-04-10 MED ORDER — TRAMADOL HCL 50 MG PO TABS: 50.0000 mg | ORAL_TABLET | Freq: Two times a day (BID) | ORAL | 0 refills | Status: AC | PRN

## 2021-04-10 MED ORDER — DICLOFENAC SODIUM ER 100 MG PO TB24: 100.0000 mg | ORAL_TABLET | Freq: Every day | ORAL | 0 refills | Status: DC
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
## 2021-04-10 NOTE — ED Triage Notes (Signed)
Patient BIB [facility] from home. Patient fell down after playing pool while his right ankle gave out, did not lose consciousness. Patient thinks his LEFT ankle is broken. Alcohol on board. History of diabetes CBG 158.

## 2021-04-10 NOTE — ED Notes (Signed)
Patient provided with crutches and educated on proper use with how to use them. Teach back and return demonstration performed.

## 2021-04-10 NOTE — ED Notes (Signed)
Ortho paged. 

## 2021-04-10 NOTE — ED Notes (Signed)
Ortho tech at bedside applying the ordered brace/splint for patient.

## 2021-04-10 NOTE — ED Notes (Signed)
Educated patient on NOT drinking while on pain medication. Dr. Nicanor Alcon educated patient on importance of this as well.

## 2021-04-10 NOTE — ED Notes (Signed)
Ortho called to splint patient.

## 2021-04-10 NOTE — Progress Notes (Signed)
Orthopedic Tech Progress Note Patient Details:  Stephen Knight 02/17/1966 [id number]  Patient ID: [name], male   DOB: 10-17-1965, 55 y.o.   MRN: [id number] After speaking with Dr I applied a posterior short leg with stirrup. [name] 04/10/2021, 4:50 AM

## 2021-04-12 ENCOUNTER — Encounter: Payer: Self-pay | Admitting: Orthopaedic Surgery

## 2021-04-12 ENCOUNTER — Other Ambulatory Visit: Payer: Self-pay

## 2021-04-12 ENCOUNTER — Ambulatory Visit (INDEPENDENT_AMBULATORY_CARE_PROVIDER_SITE_OTHER): Payer: Self-pay | Admitting: Orthopaedic Surgery

## 2021-04-12 ENCOUNTER — Encounter (HOSPITAL_BASED_OUTPATIENT_CLINIC_OR_DEPARTMENT_OTHER): Payer: Self-pay | Admitting: Orthopaedic Surgery

## 2021-04-12 VITALS — Ht 71.0 in | Wt 240.0 lb

## 2021-04-12 DIAGNOSIS — S82842A Displaced bimalleolar fracture of left lower leg, initial encounter for closed fracture: Secondary | ICD-10-CM

## 2021-04-12 DIAGNOSIS — S82899A Other fracture of unspecified lower leg, initial encounter for closed fracture: Secondary | ICD-10-CM

## 2021-04-12 DIAGNOSIS — S93432A Sprain of tibiofibular ligament of left ankle, initial encounter: Secondary | ICD-10-CM
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
## 2021-04-14 ENCOUNTER — Encounter (HOSPITAL_BASED_OUTPATIENT_CLINIC_OR_DEPARTMENT_OTHER)
Admission: RE | Admit: 2021-04-14 | Discharge: 2021-04-14 | Disposition: A | Discharge status: Home / Self Care | Payer: Self-pay | Source: Ambulatory Visit | Attending: Orthopaedic Surgery | Admitting: Orthopaedic Surgery

## 2021-04-14 ENCOUNTER — Other Ambulatory Visit: Payer: Self-pay

## 2021-04-14 DIAGNOSIS — Z01818 Encounter for other preprocedural examination: Secondary | ICD-10-CM | POA: Insufficient documentation

## 2021-04-14 LAB — BASIC METABOLIC PANEL
Anion gap: 9 (ref 5–15)
BUN: 13 mg/dL (ref 6–20)
CO2: 25 mmol/L (ref 22–32)
Calcium: 9.3 mg/dL (ref 8.9–10.3)
Chloride: 106 mmol/L (ref 98–111)
Creatinine, Ser: 1.03 mg/dL (ref 0.61–1.24)
GFR, Estimated: >60 mL/min (ref >60)
Glucose, Bld: 169 mg/dL — ABNORMAL HIGH (ref 70–99)
Potassium: 4.8 mmol/L (ref 3.5–5.1)
Sodium: 140 mmol/L (ref 135–145)

## 2021-04-14 NOTE — Progress Notes (Signed)

## 2021-04-18 ENCOUNTER — Other Ambulatory Visit: Payer: Self-pay

## 2021-04-18 ENCOUNTER — Telehealth: Payer: Self-pay

## 2021-04-18 ENCOUNTER — Ambulatory Visit
Admission: RE | Admit: 2021-04-18 | Discharge: 2021-04-18 | Disposition: A | Discharge status: Home / Self Care | Payer: Self-pay | Source: Ambulatory Visit | Attending: Orthopaedic Surgery | Admitting: Orthopaedic Surgery

## 2021-04-18 ENCOUNTER — Telehealth: Payer: Self-pay | Admitting: Orthopaedic Surgery

## 2021-04-18 DIAGNOSIS — S82899A Other fracture of unspecified lower leg, initial encounter for closed fracture: Secondary | ICD-10-CM

## 2021-04-18 NOTE — Telephone Encounter (Signed)
Patient's wife called. She would like a refill on his pain medication call in. Her call back number is [phone]

## 2021-04-18 NOTE — Telephone Encounter (Signed)
Pt's wife Lambert Mody submitted medical release forms, FMLA, and $25.00 cash payment to [employer]. Accepted 04/18/21

## 2021-04-18 NOTE — Telephone Encounter (Signed)
Pts wife came into the office after her husbands MRI stating that [facility] told her she needed to get some anti inflammatory and some pain medication to be sent in for her husband

## 2021-04-19 ENCOUNTER — Telehealth: Payer: Self-pay | Admitting: Orthopaedic Surgery

## 2021-04-19 ENCOUNTER — Other Ambulatory Visit: Payer: Self-pay | Admitting: Physician Assistant

## 2021-04-19 MED ORDER — OXYCODONE-ACETAMINOPHEN 5-325 MG PO TABS: 1.0000 | ORAL_TABLET | Freq: Every evening | ORAL | 0 refills | Status: DC | PRN

## 2021-04-19 MED ORDER — HYDROCODONE-ACETAMINOPHEN 5-325 MG PO TABS: 1.0000 | ORAL_TABLET | Freq: Three times a day (TID) | ORAL | 0 refills | Status: DC | PRN

## 2021-04-19 NOTE — Telephone Encounter (Signed)
Patients wife aware

## 2021-04-19 NOTE — Telephone Encounter (Signed)
Pt's wife Lambert Mody is asking for oxycodone to go to [facility] on [address]. Please send medication here as soon as possible. Phone number is [phone].

## 2021-04-19 NOTE — Telephone Encounter (Signed)
Can yall send in the oxycodone to this pharm instead. Would like this done ASAP. [facility] did not have Rx in stock.

## 2021-04-19 NOTE — Telephone Encounter (Signed)
done

## 2021-04-19 NOTE — Telephone Encounter (Signed)
Sent in

## 2021-04-20 ENCOUNTER — Other Ambulatory Visit: Payer: Self-pay

## 2021-04-20 ENCOUNTER — Ambulatory Visit (HOSPITAL_BASED_OUTPATIENT_CLINIC_OR_DEPARTMENT_OTHER)
Admission: RE | Admit: 2021-04-20 | Discharge: 2021-04-20 | Disposition: A | Discharge status: Home / Self Care | Payer: Self-pay | Source: Ambulatory Visit | Attending: Orthopaedic Surgery | Admitting: Orthopaedic Surgery

## 2021-04-20 ENCOUNTER — Encounter (HOSPITAL_BASED_OUTPATIENT_CLINIC_OR_DEPARTMENT_OTHER)
Admission: RE | Disposition: A | Discharge status: Home / Self Care | Payer: Self-pay | Source: Ambulatory Visit | Attending: Orthopaedic Surgery

## 2021-04-20 ENCOUNTER — Ambulatory Visit (HOSPITAL_BASED_OUTPATIENT_CLINIC_OR_DEPARTMENT_OTHER): Payer: Self-pay | Admitting: Anesthesiology

## 2021-04-20 ENCOUNTER — Ambulatory Visit (HOSPITAL_COMMUNITY): Payer: Self-pay

## 2021-04-20 ENCOUNTER — Encounter (HOSPITAL_BASED_OUTPATIENT_CLINIC_OR_DEPARTMENT_OTHER): Payer: Self-pay | Admitting: Orthopaedic Surgery

## 2021-04-20 DIAGNOSIS — S93432A Sprain of tibiofibular ligament of left ankle, initial encounter: Secondary | ICD-10-CM

## 2021-04-20 DIAGNOSIS — Z79891 Long term (current) use of opiate analgesic: Secondary | ICD-10-CM | POA: Insufficient documentation

## 2021-04-20 DIAGNOSIS — Z794 Long term (current) use of insulin: Secondary | ICD-10-CM | POA: Insufficient documentation

## 2021-04-20 DIAGNOSIS — X58XXXA Exposure to other specified factors, initial encounter: Secondary | ICD-10-CM | POA: Insufficient documentation

## 2021-04-20 DIAGNOSIS — S82842A Displaced bimalleolar fracture of left lower leg, initial encounter for closed fracture: Secondary | ICD-10-CM | POA: Insufficient documentation

## 2021-04-20 DIAGNOSIS — S93402A Sprain of unspecified ligament of left ankle, initial encounter: Secondary | ICD-10-CM | POA: Insufficient documentation

## 2021-04-20 DIAGNOSIS — Z419 Encounter for procedure for purposes other than remedying health state, unspecified: Secondary | ICD-10-CM

## 2021-04-20 HISTORY — PX: ORIF ANKLE FRACTURE: SHX5408

## 2021-04-20 LAB — GLUCOSE, CAPILLARY
Glucose-Capillary: 134 mg/dL — ABNORMAL HIGH (ref 70–99)
Glucose-Capillary: 126 mg/dL — ABNORMAL HIGH (ref 70–99)

## 2021-04-20 SURGERY — OPEN REDUCTION INTERNAL FIXATION (ORIF) ANKLE FRACTURE
Anesthesia: General | Site: Ankle | Laterality: Left

## 2021-04-20 MED ORDER — PHENYLEPHRINE 40 MCG/ML (10ML) SYRINGE FOR IV PUSH (FOR BLOOD PRESSURE SUPPORT)
PREFILLED_SYRINGE | INTRAVENOUS | Status: AC
  Filled 2021-04-20: qty 10

## 2021-04-20 MED ORDER — 0.9 % SODIUM CHLORIDE (POUR BTL) OPTIME
TOPICAL | Status: DC | PRN
  Administered 2021-04-20: 3000 mL

## 2021-04-20 MED ORDER — ONDANSETRON HCL 4 MG/2ML IJ SOLN
INTRAMUSCULAR | Status: AC
  Filled 2021-04-20: qty 2

## 2021-04-20 MED ORDER — LIDOCAINE 2% (20 MG/ML) 5 ML SYRINGE
INTRAMUSCULAR | Status: AC
  Filled 2021-04-20: qty 5

## 2021-04-20 MED ORDER — DEXAMETHASONE SODIUM PHOSPHATE 10 MG/ML IJ SOLN
INTRAMUSCULAR | Status: DC | PRN
  Administered 2021-04-20: 5 mg via INTRAVENOUS

## 2021-04-20 MED ORDER — CEFAZOLIN SODIUM-DEXTROSE 2-4 GM/100ML-% IV SOLN
INTRAVENOUS | Status: AC
  Filled 2021-04-20: qty 100

## 2021-04-20 MED ORDER — ONDANSETRON HCL 4 MG/2ML IJ SOLN
INTRAMUSCULAR | Status: DC | PRN
  Administered 2021-04-20: 4 mg via INTRAVENOUS

## 2021-04-20 MED ORDER — ROPIVACAINE HCL 5 MG/ML IJ SOLN
INTRAMUSCULAR | Status: DC | PRN
  Administered 2021-04-20: 50 mL via PERINEURAL

## 2021-04-20 MED ORDER — FENTANYL CITRATE (PF) 100 MCG/2ML IJ SOLN
100.0000 ug | Freq: Once | INTRAMUSCULAR | Status: AC
  Administered 2021-04-20: 100 ug via INTRAVENOUS

## 2021-04-20 MED ORDER — CEFAZOLIN SODIUM-DEXTROSE 2-4 GM/100ML-% IV SOLN
2.0000 g | INTRAVENOUS | Status: AC
  Administered 2021-04-20: 2 g via INTRAVENOUS

## 2021-04-20 MED ORDER — PHENYLEPHRINE HCL (PRESSORS) 10 MG/ML IV SOLN
INTRAVENOUS | Status: DC | PRN
  Administered 2021-04-20 (×2): 80 ug via INTRAVENOUS

## 2021-04-20 MED ORDER — LIDOCAINE HCL (CARDIAC) PF 100 MG/5ML IV SOSY
PREFILLED_SYRINGE | INTRAVENOUS | Status: DC | PRN
  Administered 2021-04-20: 60 mg via INTRAVENOUS

## 2021-04-20 MED ORDER — BUPIVACAINE HCL (PF) 0.25 % IJ SOLN
INTRAMUSCULAR | Status: AC
  Filled 2021-04-20: qty 30

## 2021-04-20 MED ORDER — MIDAZOLAM HCL 2 MG/2ML IJ SOLN
2.0000 mg | Freq: Once | INTRAMUSCULAR | Status: AC
  Administered 2021-04-20: 2 mg via INTRAVENOUS

## 2021-04-20 MED ORDER — FENTANYL CITRATE (PF) 100 MCG/2ML IJ SOLN
INTRAMUSCULAR | Status: AC
  Filled 2021-04-20: qty 2

## 2021-04-20 MED ORDER — PROMETHAZINE HCL 25 MG/ML IJ SOLN: 6.2500 mg | INTRAMUSCULAR | Status: DC | PRN

## 2021-04-20 MED ORDER — PROPOFOL 10 MG/ML IV BOLUS
INTRAVENOUS | Status: AC
  Filled 2021-04-20: qty 20

## 2021-04-20 MED ORDER — FENTANYL CITRATE (PF) 100 MCG/2ML IJ SOLN
INTRAMUSCULAR | Status: DC | PRN
  Administered 2021-04-20: 50 ug via INTRAVENOUS

## 2021-04-20 MED ORDER — LACTATED RINGERS IV SOLN: INTRAVENOUS | Status: DC

## 2021-04-20 MED ORDER — PROMETHAZINE HCL 25 MG PO TABS: 25.0000 mg | ORAL_TABLET | Freq: Four times a day (QID) | ORAL | 1 refills | Status: DC | PRN

## 2021-04-20 MED ORDER — OXYCODONE HCL 5 MG PO TABS
5.0000 mg | ORAL_TABLET | Freq: Once | ORAL | Status: DC | PRN
Start: 2021-04-20 — End: 2021-04-20

## 2021-04-20 MED ORDER — PROPOFOL 10 MG/ML IV BOLUS
INTRAVENOUS | Status: DC | PRN
  Administered 2021-04-20: 300 mg via INTRAVENOUS
  Administered 2021-04-20: 100 mg via INTRAVENOUS

## 2021-04-20 MED ORDER — OXYCODONE HCL 5 MG/5ML PO SOLN: 5.0000 mg | Freq: Once | ORAL | Status: DC | PRN

## 2021-04-20 MED ORDER — AMISULPRIDE (ANTIEMETIC) 5 MG/2ML IV SOLN: 10.0000 mg | Freq: Once | INTRAVENOUS | Status: DC | PRN

## 2021-04-20 MED ORDER — DEXAMETHASONE SODIUM PHOSPHATE 10 MG/ML IJ SOLN
INTRAMUSCULAR | Status: AC
  Filled 2021-04-20: qty 1

## 2021-04-20 MED ORDER — ASPIRIN EC 81 MG PO TBEC: 81.0000 mg | DELAYED_RELEASE_TABLET | Freq: Two times a day (BID) | ORAL | 0 refills | Status: AC

## 2021-04-20 MED ORDER — MEPERIDINE HCL 25 MG/ML IJ SOLN: 6.2500 mg | INTRAMUSCULAR | Status: DC | PRN

## 2021-04-20 MED ORDER — MIDAZOLAM HCL 2 MG/2ML IJ SOLN
INTRAMUSCULAR | Status: AC
  Filled 2021-04-20: qty 2

## 2021-04-20 MED ORDER — OXYCODONE-ACETAMINOPHEN 5-325 MG PO TABS: 1.0000 | ORAL_TABLET | Freq: Three times a day (TID) | ORAL | 0 refills | Status: AC | PRN

## 2021-04-20 SURGICAL SUPPLY — 97 items
BANDAGE ESMARK 6X9 LF (GAUZE/BANDAGES/DRESSINGS) ×1 IMPLANT
BIT DRILL 2.4X140 LONG SOLID (BIT) ×2 IMPLANT
BIT DRILL 2.8 (BIT) ×2
BIT DRILL LNG 140X2.8XSLD (BIT) ×1 IMPLANT
BIT DRILL SOLID 2.0 X 110MM (DRILL) ×1 IMPLANT
BIT DRL LNG 140X2.8XSLD (BIT) ×1
BLADE HEX COATED 2.75 (ELECTRODE) IMPLANT
BLADE SURG 15 STRL LF DISP TIS (BLADE) ×2 IMPLANT
BLADE SURG 15 STRL SS (BLADE) ×4
BNDG CMPR 9X6 STRL LF SNTH (GAUZE/BANDAGES/DRESSINGS) ×1
BNDG COHESIVE 6X5 TAN ST LF (GAUZE/BANDAGES/DRESSINGS) ×2 IMPLANT
BNDG ELASTIC 4X5.8 VLCR STR LF (GAUZE/BANDAGES/DRESSINGS) IMPLANT
BNDG ELASTIC 6X5.8 VLCR STR LF (GAUZE/BANDAGES/DRESSINGS) ×2 IMPLANT
BNDG ESMARK 6X9 LF (GAUZE/BANDAGES/DRESSINGS) ×2
BRUSH SCRUB EZ PLAIN DRY (MISCELLANEOUS) ×2 IMPLANT
CANISTER SUCT 1200ML W/VALVE (MISCELLANEOUS) ×2 IMPLANT
COVER BACK TABLE 60X90IN (DRAPES) ×2 IMPLANT
COVER MAYO STAND STRL (DRAPES) IMPLANT
CUFF TOURN SGL QUICK 34 (TOURNIQUET CUFF)
CUFF TRNQT CYL 34X4.125X (TOURNIQUET CUFF) IMPLANT
DECANTER SPIKE VIAL GLASS SM (MISCELLANEOUS) IMPLANT
DRAPE C-ARM 42X72 X-RAY (DRAPES) ×2 IMPLANT
DRAPE C-ARMOR (DRAPES) ×2 IMPLANT
DRAPE EXTREMITY T 121X128X90 (DISPOSABLE) ×2 IMPLANT
DRAPE IMP U-DRAPE 54X76 (DRAPES) ×2 IMPLANT
DRAPE INCISE IOBAN 66X45 STRL (DRAPES) IMPLANT
DRAPE SURG 17X23 STRL (DRAPES) ×4 IMPLANT
DRAPE U-SHAPE 47X51 STRL (DRAPES) ×2 IMPLANT
DRILL SOLID 2.0 X 110MM (DRILL) ×2
DRSG PAD ABDOMINAL 8X10 ST (GAUZE/BANDAGES/DRESSINGS) ×4 IMPLANT
DURAPREP 26ML APPLICATOR (WOUND CARE) ×4 IMPLANT
ELECT REM PT RETURN 9FT ADLT (ELECTROSURGICAL) ×2
ELECTRODE REM PT RTRN 9FT ADLT (ELECTROSURGICAL) ×1 IMPLANT
FIXATION ZIPTIGHT ANKLE SNDSMS (Ankle) ×1 IMPLANT
GAUZE SPONGE 4X4 12PLY STRL (GAUZE/BANDAGES/DRESSINGS) ×2 IMPLANT
GAUZE XEROFORM 1X8 LF (GAUZE/BANDAGES/DRESSINGS) ×2 IMPLANT
GLOVE SURG NEOP MICRO LF SZ7.5 (GLOVE) ×2 IMPLANT
GLOVE SURG POLYISO LF SZ6 (GLOVE) ×2 IMPLANT
GLOVE SURG POLYISO LF SZ7 (GLOVE) ×4 IMPLANT
GLOVE SURG SYN 7.5  E (GLOVE) ×2
GLOVE SURG SYN 7.5 E (GLOVE) ×1 IMPLANT
GLOVE SURG UNDER POLY LF SZ6 (GLOVE) ×2 IMPLANT
GLOVE SURG UNDER POLY LF SZ7 (GLOVE) ×12 IMPLANT
GLOVE SURG UNDER POLY LF SZ7.5 (GLOVE) ×2 IMPLANT
GOWN STRL REIN XL XLG (GOWN DISPOSABLE) ×2 IMPLANT
GOWN STRL REUS W/ TWL LRG LVL3 (GOWN DISPOSABLE) ×4 IMPLANT
GOWN STRL REUS W/ TWL XL LVL3 (GOWN DISPOSABLE) ×1 IMPLANT
GOWN STRL REUS W/TWL LRG LVL3 (GOWN DISPOSABLE) ×8
GOWN STRL REUS W/TWL XL LVL3 (GOWN DISPOSABLE) ×2
MANIFOLD NEPTUNE II (INSTRUMENTS) ×2 IMPLANT
NEEDLE HYPO 22GX1.5 SAFETY (NEEDLE) IMPLANT
NS IRRIG 1000ML POUR BTL (IV SOLUTION) ×2 IMPLANT
PACK BASIN DAY SURGERY FS (CUSTOM PROCEDURE TRAY) ×2 IMPLANT
PAD CAST 3X4 CTTN HI CHSV (CAST SUPPLIES) IMPLANT
PAD CAST 4YDX4 CTTN HI CHSV (CAST SUPPLIES) IMPLANT
PADDING CAST COTTON 3X4 STRL (CAST SUPPLIES)
PADDING CAST COTTON 4X4 STRL (CAST SUPPLIES)
PADDING CAST COTTON 6X4 STRL (CAST SUPPLIES) IMPLANT
PADDING CAST SYN 6 (CAST SUPPLIES) ×1
PADDING CAST SYNTHETIC 4 (CAST SUPPLIES) ×1
PADDING CAST SYNTHETIC 4X4 STR (CAST SUPPLIES) ×1 IMPLANT
PADDING CAST SYNTHETIC 6X4 NS (CAST SUPPLIES) ×1 IMPLANT
PENCIL SMOKE EVACUATOR (MISCELLANEOUS) ×2 IMPLANT
PLATE FIBULAR CL 11H LT (Plate) ×2 IMPLANT
PUTTY DBM STAGRAFT 2CC (Putty) ×2 IMPLANT
SCREW LOCK PLATE R3 2.7X14 (Screw) ×2 IMPLANT
SCREW LOCK PLATE R3 2.7X15 (Screw) ×2 IMPLANT
SCREW LOCK PLATE R3 4.2X18 (Screw) ×4 IMPLANT
SCREW LOCK PLT 18X4.2XNS R3CON (Screw) ×2 IMPLANT
SCREW NON LOCKING 3.5X12 (Screw) ×6 IMPLANT
SCREW NON LOCKING 3.5X14 (Screw) ×2 IMPLANT
SHEET MEDIUM DRAPE 40X70 STRL (DRAPES) ×4 IMPLANT
SLEEVE SCD COMPRESS KNEE MED (STOCKING) ×2 IMPLANT
SPLINT FIBERGLASS 4X30 (CAST SUPPLIES) ×2 IMPLANT
SPONGE T-LAP 18X18 ~~LOC~~+RFID (SPONGE) ×2 IMPLANT
STOCKINETTE 6  STRL (DRAPES) ×2
STOCKINETTE 6 STRL (DRAPES) ×1 IMPLANT
STOCKINETTE TUBULAR 6 INCH (GAUZE/BANDAGES/DRESSINGS) ×2 IMPLANT
SUCTION FRAZIER HANDLE 10FR (MISCELLANEOUS) ×2
SUCTION TUBE FRAZIER 10FR DISP (MISCELLANEOUS) ×1 IMPLANT
SUT ETHILON 3 0 PS 1 (SUTURE) ×4 IMPLANT
SUT VIC AB 0 CT1 27 (SUTURE)
SUT VIC AB 0 CT1 27XBRD ANBCTR (SUTURE) IMPLANT
SUT VIC AB 2-0 CT1 27 (SUTURE) ×4
SUT VIC AB 2-0 CT1 TAPERPNT 27 (SUTURE) ×2 IMPLANT
SUT VIC AB 3-0 SH 27 (SUTURE)
SUT VIC AB 3-0 SH 27X BRD (SUTURE) IMPLANT
SYNDESMOSIS TIGHTROPE XP (Orthopedic Implant) ×4 IMPLANT
SYR BULB EAR ULCER 3OZ GRN STR (SYRINGE) ×2 IMPLANT
SYR CONTROL 10ML LL (SYRINGE) IMPLANT
TOWEL GREEN STERILE FF (TOWEL DISPOSABLE) ×2 IMPLANT
TRAY DSU PREP LF (CUSTOM PROCEDURE TRAY) ×2 IMPLANT
TUBE CONNECTING 20X1/4 (TUBING) ×2 IMPLANT
UNDERPAD 30X36 HEAVY ABSORB (UNDERPADS AND DIAPERS) ×2 IMPLANT
WIRE OLIVE SMOOTH 1.4MMX60MM (WIRE) ×4 IMPLANT
YANKAUER SUCT BULB TIP NO VENT (SUCTIONS) ×2 IMPLANT
ZIPTIGHT ANKLE SYNODESMOSS FIX (Ankle) ×2 IMPLANT

## 2021-04-20 NOTE — Op Note (Addendum)
f  Date of Surgery: 04/20/2021  INDICATIONS: Stephen Knight is a 55 y.o.-year-old male who sustained a left ankle fracture; he was indicated for open reduction and internal fixation due to the displaced nature of the articular fracture and came to the operating room today for this procedure. The Patient did consent to the procedure after discussion of the risks and benefits.  PREOPERATIVE DIAGNOSIS: left bimalleolar ankle fracture and syndesmosis rupture  POSTOPERATIVE DIAGNOSIS: Same.  PROCEDURE:  Open treatment of left ankle fracture with internal fixation. Bimalleolar CPT [id number] Open treatment of left ankle syndesmosis rupture with internal fixation.  CPT [phone]  SURGEON: N. Glee Arvin, M.D.  ASSIST: None  ANESTHESIA:  general, regional  TOURNIQUET TIME: 75 minutes  IV FLUIDS AND URINE: See anesthesia.  ESTIMATED BLOOD LOSS: minimal mL.  IMPLANTS: Implant Name Type Inv. Item Serial No. Manufacturer Lot No. LRB No. Used Action  Plate 11 Hole Fibula Left    [employer]AGON [employer] ON STERIILE SET Left 1 Implanted  SCREW 4.2 MM X 18 MM    [employer] ON STERILE SET Left 2 Implanted  SCREW LOCK PLATE R3 9.3G18 - [id number] Screw SCREW LOCK PLATE R3 7.8L38  [employer] ON STERILE SET Left 1 Implanted  SCREW LOCK PLATE R3 1.0F75 - [id number] Screw SCREW LOCK PLATE R3 8.2U23  PAR[employer] 28 [employer] ON STERILE SET Left 1 Implanted  SCREW NON LOCKING 3.5X14 - [id number] Screw SCREW NON LOCKING 3.5X14  [employer] 28 [employer] ON STERILE SET Left 1 Implanted  SCREW NON LOCKING 3.5X12 - [id number] Screw SCREW NON LOCKING 3.5X12  PARAGON 28 [employer] ON STERILE SET Left 3 Implanted  SYNDESMOSIS TIGHTROPE XP - [id number] Orthopedic Implant SYNDESMOSIS TIGHTROPE XP  [employer] [id number] Left 1 Implanted and Explanted  SYNDESMOSIS TIGHTROPE XP - [id number] Orthopedic Implant SYNDESMOSIS TIGHTROPE XP  [employer] [id number] Left 1 Implanted  PUTTY DBM STAGRAFT 2CC - [id number] Putty PUTTY DBM STAGRAFT 2CC  ZIMMER  RECON(ORTH,TRAU,BIO,SG) [id number] Left 1 Implanted  ZIPTIGHT ANKLE SYNODESMOSS FIX - [id number] Ankle ZIPTIGHT ANKLE SYNODESMOSS FIX  ZIMMER RECON(ORTH,TRAU,BIO,SG) [id number] Left 1 Implanted    COMPLICATIONS: see description of procedure.  DESCRIPTION OF PROCEDURE: The patient was brought to the operating room and placed supine on the operating table.  The patient had been signed prior to the procedure and this was documented. The patient had the anesthesia placed by the anesthesiologist.  A nonsterile tourniquet was placed on the upper thigh.  The prep verification and incision time-outs were performed to confirm that this was the correct patient, site, side and location. The patient had an SCD on the opposite lower extremity. The patient did receive antibiotics prior to the incision and was re-dosed during the procedure as needed at indicated intervals.  The patient had the lower extremity prepped and draped in the standard surgical fashion.  The extremity was exsanguinated using an esmarch bandage and the tourniquet was inflated to 300 mm Hg.  An incision was created over the lateral aspect of the fibula.  Full-thickness flaps were raised off of the fascia.  The fascia was incised in line with the incision and subperiosteal elevation was performed.  The fracture was exposed.  There is significant comminution to the fracture and bone loss.  Anatomic reduction was not attainable due to the characteristics of the fracture.  The fracture was then mobilized and brought out to length and alignment confirmed under fluoroscopy.  This also brought the posterior malleolus fracture into reduction.  Provisional fixation was achieved with  lobster claw.  The plate was then placed on the fibula using fluoroscopic guidance.  We first placed several unicortical screws in the distal cluster of the plate through the lateral malleolus.  Each screw had excellent fixation.  We then pulled the fracture out to length and alignment to  restore the talocrural angle and distance nonlocking bicortical screws were placed in the proximal holes of the plate each with excellent purchase.  Given the bone loss, I placed 2 cc of DBM putty in the void.  After this was accomplished the syndesmosis was still widened and the medial clear space was still widened therefore we made a stab incision on the medial aspect of the ankle and a King tong clamp was used to reduce the syndesmosis under fluoroscopy.  2 tight ropes were then placed parallel to the ankle joint and tensioned.  Final x-rays were taken.  The surgical sites were thoroughly irrigated with normal saline and closed in layered fashion.  Short leg splint was placed.  Patient tolerated procedure well had no immediate complications.  POSTOPERATIVE PLAN: Mr. Fagin will remain nonweightbearing on this leg for approximately 6 weeks; Mr. Ofallon will return for suture removal in 2 weeks.  He will be immobilized in a short leg splint and then transitioned to a CAM walker at his first follow up appointment.  Mr. Keay will receive DVT prophylaxis based on other medications, activity level, and risk ratio of bleeding to thrombosis.  Mayra Reel, MD [facility] 2:01 PM

## 2021-04-20 NOTE — Progress Notes (Signed)
Assisted Dr. Miller with left, ultrasound guided, popliteal, adductor canal block. Side rails up, monitors on throughout procedure. See vital signs in flow sheet. Tolerated Procedure well. 

## 2021-04-20 NOTE — Discharge Instructions (Addendum)
    1. Keep splint clean and dry 2. Elevate foot above level of the heart 3. Take aspirin to prevent blood clots 4. Take pain meds as needed 5. Strict non weight bearing to operative extremity   Post Anesthesia Home Care Instructions  Activity: Get plenty of rest for the remainder of the day. A responsible individual must stay with you for 24 hours following the procedure.  For the next 24 hours, DO NOT: -Drive a car -Operate machinery -Drink alcoholic beverages -Take any medication unless instructed by your physician -Make any legal decisions or sign important papers.  Meals: Start with liquid foods such as gelatin or soup. Progress to regular foods as tolerated. Avoid greasy, spicy, heavy foods. If nausea and/or vomiting occur, drink only clear liquids until the nausea and/or vomiting subsides. Call your physician if vomiting continues.  Special Instructions/Symptoms: Your throat may feel dry or sore from the anesthesia or the breathing tube placed in your throat during surgery. If this causes discomfort, gargle with warm salt water. The discomfort should disappear within 24 hours.  If you had a scopolamine patch placed behind your ear for the management of post- operative nausea and/or vomiting:  1. The medication in the patch is effective for 72 hours, after which it should be removed.  Wrap patch in a tissue and discard in the trash. Wash hands thoroughly with soap and water. 2. You may remove the patch earlier than 72 hours if you experience unpleasant side effects which may include dry mouth, dizziness or visual disturbances. 3. Avoid touching the patch. Wash your hands with soap and water after contact with the patch.  Regional Anesthesia Blocks  1. Numbness or the inability to move the "blocked" extremity may last from 3-48 hours after placement. The length of time depends on the medication injected and your individual response to the medication. If the numbness is not  going away after 48 hours, call your surgeon.  2. The extremity that is blocked will need to be protected until the numbness is gone and the  Strength has returned. Because you cannot feel it, you will need to take extra care to avoid injury. Because it may be weak, you may have difficulty moving it or using it. You may not know what position it is in without looking at it while the block is in effect.  3. For blocks in the legs and feet, returning to weight bearing and walking needs to be done carefully. You will need to wait until the numbness is entirely gone and the strength has returned. You should be able to move your leg and foot normally before you try and bear weight or walk. You will need someone to be with you when you first try to ensure you do not fall and possibly risk injury.  4. Bruising and tenderness at the needle site are common side effects and will resolve in a few days.  5. Persistent numbness or new problems with movement should be communicated to the surgeon or the  Surgery Center ([phone])/ [facility] ([phone]).      

## 2021-04-20 NOTE — Anesthesia Postprocedure Evaluation (Signed)
Anesthesia Post Note  Patient: Aarin Bluett  Procedure(s) Performed: OPEN REDUCTION INTERNAL FIXATION (ORIF) LEFT BIMALLEOLAR ANKLE FRACTURE (Left: Ankle)     Patient location during evaluation: PACU Anesthesia Type: General Level of consciousness: awake and alert Pain management: pain level controlled Vital Signs Assessment: post-procedure vital signs reviewed and stable Respiratory status: spontaneous breathing, nonlabored ventilation and respiratory function stable Cardiovascular status: blood pressure returned to baseline and stable Postop Assessment: no apparent nausea or vomiting Anesthetic complications: no   No notable events documented.  Last Vitals:  Vitals:   04/20/21 1400 04/20/21 1415  BP: (!) 86/56 (!) 132/92  Pulse: 88 98  Resp: 16 14  Temp: 36.6 C   SpO2: 94% 100%    Last Pain:  Vitals:   04/20/21 1400  TempSrc:   PainSc: 0-No pain                 Lucretia Kern

## 2021-04-20 NOTE — Transfer of Care (Signed)
Immediate Anesthesia Transfer of Care Note  Patient: Baylee Mccorkel  Procedure(s) Performed: OPEN REDUCTION INTERNAL FIXATION (ORIF) LEFT BIMALLEOLAR ANKLE FRACTURE (Left: Ankle)  Patient Location: PACU  Anesthesia Type:GA combined with regional for post-op pain  Level of Consciousness: drowsy  Airway & Oxygen Therapy: Patient Spontanous Breathing and Patient connected to face mask oxygen  Post-op Assessment: Report given to RN and Post -op Vital signs reviewed and stable  Post vital signs: Reviewed and stable  Last Vitals:  Vitals Value Taken Time  BP 86/56 04/20/21 1401  Temp    Pulse 90 04/20/21 1403  Resp 12 04/20/21 1403  SpO2 96 % 04/20/21 1403  Vitals shown include unvalidated device data.  Last Pain:  Vitals:   04/20/21 1005  TempSrc: Oral  PainSc: 9       Patients Stated Pain Goal: 3 (04/20/21 1005)  Complications: No notable events documented.

## 2021-04-20 NOTE — Anesthesia Procedure Notes (Signed)
Anesthesia Regional Block: Popliteal block   Pre-Anesthetic Checklist: , timeout performed,  Correct Patient, Correct Site, Correct Laterality,  Correct Procedure, Correct Position, site marked,  Risks and benefits discussed,  Surgical consent,  Pre-op evaluation,  At surgeon's request and post-op pain management  Laterality: Left  Prep: chloraprep       Needles:  Injection technique: Single-shot  Needle Type: Stimiplex     Needle Length: 9cm  Needle Gauge: 21     Additional Needles:   Procedures:,,,, ultrasound used (permanent image in chart),,    Narrative:  Start time: 04/20/2021 10:42 AM End time: 04/20/2021 10:47 AM Injection made incrementally with aspirations every 5 mL.  Performed by: Personally  Anesthesiologist: Lowella Curb, MD
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
## 2021-04-20 NOTE — Anesthesia Procedure Notes (Signed)
Anesthesia Regional Block: Adductor canal block   Pre-Anesthetic Checklist: , timeout performed,  Correct Patient, Correct Site, Correct Laterality,  Correct Procedure, Correct Position, site marked,  Risks and benefits discussed,  Surgical consent,  Pre-op evaluation,  At surgeon's request and post-op pain management  Laterality: Left  Prep: chloraprep       Needles:  Injection technique: Single-shot  Needle Type: Stimiplex     Needle Length: 9cm  Needle Gauge: 21     Additional Needles:   Procedures:,,,, ultrasound used (permanent image in chart),,    Narrative:  Start time: 04/20/2021 10:42 AM End time: 04/20/2021 10:47 AM Injection made incrementally with aspirations every 5 mL.  Performed by: Personally  Anesthesiologist: Lowella Curb, MD

## 2021-04-20 NOTE — Anesthesia Procedure Notes (Signed)
Procedure Name: LMA Insertion Date/Time: 04/20/2021 12:18 PM Performed by: Lauralyn Primes, CRNA Pre-anesthesia Checklist: Patient identified, Emergency Drugs available, Suction available and Patient being monitored Patient Re-evaluated:Patient Re-evaluated prior to induction Oxygen Delivery Method: Circle system utilized Preoxygenation: Pre-oxygenation with 100% oxygen Induction Type: IV induction Ventilation: Mask ventilation without difficulty LMA: LMA inserted LMA Size: 5.0 Number of attempts: 1 Airway Equipment and Method: Bite block Placement Confirmation: positive ETCO2 Tube secured with: Tape Dental Injury: Teeth and Oropharynx as per pre-operative assessment

## 2021-04-20 NOTE — Anesthesia Preprocedure Evaluation (Signed)
Anesthesia Evaluation  Patient identified by MRN, date of birth, ID band Patient awake    Reviewed: Allergy & Precautions, NPO status , Patient's Chart, lab work & pertinent test results  History of Anesthesia Complications Negative for: history of anesthetic complications  Airway Mallampati: III  TM Distance: >3 FB Neck ROM: Full    Dental no notable dental hx. (+) Dental Advisory Given   Pulmonary neg pulmonary ROS, neg recent URI, Not current smoker,    Pulmonary exam normal breath sounds clear to auscultation       Cardiovascular negative cardio ROS Normal cardiovascular exam Rhythm:Regular Rate:Normal     Neuro/Psych negative neurological ROS     GI/Hepatic Neg liver ROS, cholecystitis   Endo/Other  diabetes, Insulin DependentMorbid obesity  Renal/GU negative Renal ROS     Musculoskeletal negative musculoskeletal ROS (+)   Abdominal (+) + obese,   Peds  Hematology negative hematology ROS (+)   Anesthesia Other Findings   Reproductive/Obstetrics                             Anesthesia Physical  Anesthesia Plan  ASA: 3  Anesthesia Plan: General   Post-op Pain Management:  Regional for Post-op pain   Induction: Intravenous  PONV Risk Score and Plan: 2 and Ondansetron, Midazolam and Treatment may vary due to age or medical condition  Airway Management Planned: LMA  Additional Equipment: None  Intra-op Plan:   Post-operative Plan: Extubation in OR  Informed Consent: I have reviewed the patients History and Physical, chart, labs and discussed the procedure including the risks, benefits and alternatives for the proposed anesthesia with the patient or authorized representative who has indicated his/her understanding and acceptance.     Dental advisory given  Plan Discussed with: CRNA and Surgeon  Anesthesia Plan Comments:         Anesthesia Quick Evaluation

## 2021-04-21 NOTE — Progress Notes (Signed)
Left message stating courtesy call and if any questions or concerns please call the doctors office.  

## 2021-04-21 NOTE — Telephone Encounter (Signed)
thanks

## 2021-04-25 ENCOUNTER — Encounter (HOSPITAL_BASED_OUTPATIENT_CLINIC_OR_DEPARTMENT_OTHER): Payer: Self-pay | Admitting: Orthopaedic Surgery

## 2021-05-04 ENCOUNTER — Encounter: Payer: Self-pay | Admitting: Orthopaedic Surgery

## 2021-05-04 ENCOUNTER — Ambulatory Visit (INDEPENDENT_AMBULATORY_CARE_PROVIDER_SITE_OTHER): Payer: Self-pay | Admitting: Orthopaedic Surgery

## 2021-05-04 ENCOUNTER — Other Ambulatory Visit: Payer: Self-pay

## 2021-05-04 ENCOUNTER — Ambulatory Visit: Payer: Self-pay

## 2021-05-04 DIAGNOSIS — S82842A Displaced bimalleolar fracture of left lower leg, initial encounter for closed fracture: Secondary | ICD-10-CM

## 2021-05-04 MED ORDER — HYDROCODONE-ACETAMINOPHEN 5-325 MG PO TABS: 1.0000 | ORAL_TABLET | Freq: Three times a day (TID) | ORAL | 0 refills | Status: DC | PRN
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
## 2021-05-10 ENCOUNTER — Encounter: Payer: Self-pay | Admitting: Orthopaedic Surgery

## 2021-06-01 ENCOUNTER — Ambulatory Visit (INDEPENDENT_AMBULATORY_CARE_PROVIDER_SITE_OTHER): Payer: Self-pay

## 2021-06-01 ENCOUNTER — Ambulatory Visit (INDEPENDENT_AMBULATORY_CARE_PROVIDER_SITE_OTHER): Payer: Self-pay | Admitting: Orthopaedic Surgery

## 2021-06-01 ENCOUNTER — Other Ambulatory Visit: Payer: Self-pay

## 2021-06-01 DIAGNOSIS — S82842A Displaced bimalleolar fracture of left lower leg, initial encounter for closed fracture: Secondary | ICD-10-CM
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
## 2021-06-29 ENCOUNTER — Ambulatory Visit (INDEPENDENT_AMBULATORY_CARE_PROVIDER_SITE_OTHER): Payer: Self-pay

## 2021-06-29 ENCOUNTER — Ambulatory Visit (INDEPENDENT_AMBULATORY_CARE_PROVIDER_SITE_OTHER): Payer: Self-pay | Admitting: Orthopaedic Surgery

## 2021-06-29 ENCOUNTER — Encounter: Payer: Self-pay | Admitting: Orthopaedic Surgery

## 2021-06-29 ENCOUNTER — Other Ambulatory Visit: Payer: Self-pay

## 2021-06-29 DIAGNOSIS — S82842A Displaced bimalleolar fracture of left lower leg, initial encounter for closed fracture: Secondary | ICD-10-CM

## 2021-06-29 DIAGNOSIS — M79605 Pain in left leg: Secondary | ICD-10-CM
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
## 2021-06-30 ENCOUNTER — Ambulatory Visit: Payer: Self-pay | Attending: Physician Assistant | Admitting: Physical Therapy

## 2021-06-30 DIAGNOSIS — S82842A Displaced bimalleolar fracture of left lower leg, initial encounter for closed fracture: Secondary | ICD-10-CM | POA: Insufficient documentation

## 2021-06-30 DIAGNOSIS — M25572 Pain in left ankle and joints of left foot: Secondary | ICD-10-CM | POA: Insufficient documentation

## 2021-06-30 DIAGNOSIS — R600 Localized edema: Secondary | ICD-10-CM | POA: Insufficient documentation

## 2021-06-30 DIAGNOSIS — M6281 Muscle weakness (generalized): Secondary | ICD-10-CM | POA: Insufficient documentation

## 2021-06-30 DIAGNOSIS — R2689 Other abnormalities of gait and mobility: Secondary | ICD-10-CM | POA: Insufficient documentation

## 2021-06-30 DIAGNOSIS — M6289 Other specified disorders of muscle: Secondary | ICD-10-CM | POA: Insufficient documentation

## 2021-07-01 ENCOUNTER — Encounter: Payer: Self-pay | Admitting: Physical Therapy

## 2021-07-07 ENCOUNTER — Telehealth: Payer: Self-pay

## 2021-07-07 NOTE — Telephone Encounter (Signed)
Called patient, wife answered advised to give [place] a callback. He needs to come in to pick up ASO Brace per Mardella Layman.

## 2021-07-12 ENCOUNTER — Ambulatory Visit: Payer: Self-pay | Attending: Physician Assistant | Admitting: Physical Therapy

## 2021-07-12 ENCOUNTER — Encounter: Payer: Self-pay | Admitting: Physical Therapy

## 2021-07-12 ENCOUNTER — Other Ambulatory Visit: Payer: Self-pay

## 2021-07-12 DIAGNOSIS — M25572 Pain in left ankle and joints of left foot: Secondary | ICD-10-CM | POA: Insufficient documentation

## 2021-07-12 DIAGNOSIS — R2689 Other abnormalities of gait and mobility: Secondary | ICD-10-CM | POA: Insufficient documentation

## 2021-07-12 DIAGNOSIS — M6281 Muscle weakness (generalized): Secondary | ICD-10-CM | POA: Insufficient documentation

## 2021-07-12 NOTE — Telephone Encounter (Signed)
Called patient no answer. Could not leave VM.
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
## 2021-07-13 NOTE — Telephone Encounter (Signed)
Called patient no answer. Call # 3  

## 2021-07-14 ENCOUNTER — Other Ambulatory Visit: Payer: Self-pay

## 2021-07-14 ENCOUNTER — Encounter: Payer: Self-pay | Admitting: Physical Therapy

## 2021-07-14 ENCOUNTER — Ambulatory Visit: Payer: Self-pay | Admitting: Physical Therapy

## 2021-07-14 DIAGNOSIS — R2689 Other abnormalities of gait and mobility: Secondary | ICD-10-CM

## 2021-07-14 DIAGNOSIS — M6281 Muscle weakness (generalized): Secondary | ICD-10-CM

## 2021-07-14 DIAGNOSIS — M25572 Pain in left ankle and joints of left foot: Secondary | ICD-10-CM
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
## 2021-07-19 ENCOUNTER — Ambulatory Visit: Payer: Self-pay | Admitting: Physical Therapy

## 2021-07-21 ENCOUNTER — Other Ambulatory Visit: Payer: Self-pay | Admitting: Physician Assistant

## 2021-07-21 ENCOUNTER — Encounter: Payer: Self-pay | Admitting: Physical Therapy

## 2021-07-21 ENCOUNTER — Ambulatory Visit: Payer: Self-pay | Admitting: Physical Therapy

## 2021-07-21 ENCOUNTER — Other Ambulatory Visit: Payer: Self-pay

## 2021-07-21 DIAGNOSIS — M6281 Muscle weakness (generalized): Secondary | ICD-10-CM

## 2021-07-21 DIAGNOSIS — M25572 Pain in left ankle and joints of left foot: Secondary | ICD-10-CM
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
## 2021-07-25 ENCOUNTER — Encounter (INDEPENDENT_AMBULATORY_CARE_PROVIDER_SITE_OTHER): Payer: Self-pay

## 2021-07-25 ENCOUNTER — Other Ambulatory Visit: Payer: Self-pay

## 2021-07-25 ENCOUNTER — Ambulatory Visit: Payer: Self-pay | Admitting: Physical Therapy

## 2021-07-25 ENCOUNTER — Encounter: Payer: Self-pay | Admitting: Physical Therapy

## 2021-07-25 DIAGNOSIS — R2689 Other abnormalities of gait and mobility: Secondary | ICD-10-CM

## 2021-07-25 DIAGNOSIS — M25572 Pain in left ankle and joints of left foot: Secondary | ICD-10-CM

## 2021-07-25 DIAGNOSIS — M6281 Muscle weakness (generalized): Secondary | ICD-10-CM
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
## 2021-07-26 ENCOUNTER — Ambulatory Visit (INDEPENDENT_AMBULATORY_CARE_PROVIDER_SITE_OTHER): Payer: Self-pay | Admitting: Orthopaedic Surgery

## 2021-07-26 ENCOUNTER — Ambulatory Visit (INDEPENDENT_AMBULATORY_CARE_PROVIDER_SITE_OTHER): Payer: Self-pay

## 2021-07-26 DIAGNOSIS — S82842A Displaced bimalleolar fracture of left lower leg, initial encounter for closed fracture: Secondary | ICD-10-CM
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
## 2021-08-04 ENCOUNTER — Ambulatory Visit: Payer: Self-pay | Admitting: Physical Therapy

## 2021-08-09 ENCOUNTER — Ambulatory Visit: Payer: Self-pay | Admitting: Physical Therapy

## 2021-08-11 ENCOUNTER — Ambulatory Visit: Payer: Self-pay | Attending: Physician Assistant | Admitting: Physical Therapy

## 2023-02-05 ENCOUNTER — Other Ambulatory Visit: Payer: Self-pay

## 2023-02-05 ENCOUNTER — Ambulatory Visit (HOSPITAL_COMMUNITY)
Admission: EM | Admit: 2023-02-05 | Discharge: 2023-02-05 | Disposition: A | Discharge status: Home / Self Care | Payer: Self-pay | Attending: Internal Medicine | Admitting: Internal Medicine

## 2023-02-05 ENCOUNTER — Encounter (HOSPITAL_COMMUNITY): Payer: Self-pay | Admitting: *Deleted

## 2023-02-05 DIAGNOSIS — B350 Tinea barbae and tinea capitis: Secondary | ICD-10-CM

## 2023-02-05 MED ORDER — CLOTRIMAZOLE-BETAMETHASONE 1-0.05 % EX CREA: TOPICAL_CREAM | CUTANEOUS | 0 refills | Status: AC

## 2023-02-05 MED ORDER — TERBINAFINE HCL 250 MG PO TABS: 250.0000 mg | ORAL_TABLET | Freq: Every day | ORAL | 0 refills | Status: AC

## 2023-02-05 NOTE — ED Triage Notes (Signed)
Pt reports the area that is hot and he has developed a rash under his chin. Pt reports area itches and he has applied Benadryl cream to rash.

## 2023-02-05 NOTE — Discharge Instructions (Addendum)
Please apply the lotion to the area involved Please take medications as directed Please keep the neck area dry If you have worsening symptoms please return to urgent care to be evaluated.
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
